# Patient Record
Sex: Female | Born: 1960 | Race: White | Hispanic: No | Marital: Married | Smoking: Former smoker
Health system: Southern US, Community
[De-identification: ages and names within clinical notes are randomized; demographics above are authoritative.]

## PROBLEM LIST (undated history)

## (undated) DIAGNOSIS — R011 Cardiac murmur, unspecified: Secondary | ICD-10-CM

## (undated) DIAGNOSIS — J069 Acute upper respiratory infection, unspecified: Secondary | ICD-10-CM

## (undated) DIAGNOSIS — N189 Chronic kidney disease, unspecified: Secondary | ICD-10-CM

## (undated) DIAGNOSIS — N2889 Other specified disorders of kidney and ureter: Secondary | ICD-10-CM

## (undated) HISTORY — PX: FRACTURE SURGERY: SHX138

## (undated) HISTORY — PX: FOOT SURGERY: SHX648

## (undated) HISTORY — PX: OTHER SURGICAL HISTORY: SHX169

---

## 1999-01-28 DIAGNOSIS — D682 Hereditary deficiency of other clotting factors: Secondary | ICD-10-CM

## 1999-01-28 HISTORY — DX: Hereditary deficiency of other clotting factors: D68.2

## 2003-05-15 ENCOUNTER — Other Ambulatory Visit: Admission: RE | Admit: 2003-05-15 | Discharge: 2003-05-15 | Payer: Self-pay | Admitting: Obstetrics and Gynecology

## 2004-02-06 ENCOUNTER — Ambulatory Visit (HOSPITAL_COMMUNITY): Admission: RE | Admit: 2004-02-06 | Discharge: 2004-02-06 | Payer: Self-pay | Admitting: Obstetrics and Gynecology

## 2004-03-05 ENCOUNTER — Ambulatory Visit (HOSPITAL_COMMUNITY): Admission: RE | Admit: 2004-03-05 | Discharge: 2004-03-05 | Payer: Self-pay | Admitting: Obstetrics and Gynecology

## 2004-03-05 ENCOUNTER — Encounter (INDEPENDENT_AMBULATORY_CARE_PROVIDER_SITE_OTHER): Payer: Self-pay | Admitting: Specialist

## 2004-08-08 ENCOUNTER — Other Ambulatory Visit: Admission: RE | Admit: 2004-08-08 | Discharge: 2004-08-08 | Payer: Self-pay | Admitting: Obstetrics and Gynecology

## 2004-10-28 ENCOUNTER — Ambulatory Visit (HOSPITAL_COMMUNITY): Admission: RE | Admit: 2004-10-28 | Discharge: 2004-10-28 | Payer: Self-pay | Admitting: Obstetrics and Gynecology

## 2004-10-31 ENCOUNTER — Inpatient Hospital Stay (HOSPITAL_COMMUNITY): Admission: AD | Admit: 2004-10-31 | Discharge: 2004-10-31 | Payer: Self-pay | Admitting: Obstetrics and Gynecology

## 2005-03-26 ENCOUNTER — Ambulatory Visit: Payer: Self-pay | Admitting: Sports Medicine

## 2005-05-07 ENCOUNTER — Ambulatory Visit: Payer: Self-pay | Admitting: Sports Medicine

## 2006-04-01 ENCOUNTER — Inpatient Hospital Stay (HOSPITAL_COMMUNITY): Admission: AD | Admit: 2006-04-01 | Discharge: 2006-04-01 | Payer: Self-pay | Admitting: Obstetrics and Gynecology

## 2006-04-04 ENCOUNTER — Inpatient Hospital Stay (HOSPITAL_COMMUNITY): Admission: AD | Admit: 2006-04-04 | Discharge: 2006-04-04 | Payer: Self-pay | Admitting: Obstetrics and Gynecology

## 2006-05-25 ENCOUNTER — Ambulatory Visit (HOSPITAL_COMMUNITY): Admission: RE | Admit: 2006-05-25 | Discharge: 2006-05-25 | Payer: Self-pay | Admitting: Obstetrics and Gynecology

## 2007-07-06 ENCOUNTER — Ambulatory Visit (HOSPITAL_BASED_OUTPATIENT_CLINIC_OR_DEPARTMENT_OTHER): Admission: RE | Admit: 2007-07-06 | Discharge: 2007-07-06 | Payer: Self-pay | Admitting: Obstetrics and Gynecology

## 2007-08-12 ENCOUNTER — Ambulatory Visit: Payer: Self-pay | Admitting: Hematology

## 2007-08-30 ENCOUNTER — Ambulatory Visit (HOSPITAL_COMMUNITY): Admission: RE | Admit: 2007-08-30 | Discharge: 2007-08-30 | Payer: Self-pay | Admitting: Obstetrics & Gynecology

## 2007-08-30 ENCOUNTER — Encounter (INDEPENDENT_AMBULATORY_CARE_PROVIDER_SITE_OTHER): Payer: Self-pay | Admitting: Obstetrics & Gynecology

## 2007-09-02 LAB — CBC WITH DIFFERENTIAL/PLATELET
BASO%: 0.4 % (ref 0.0–2.0)
Basophils Absolute: 0 10*3/uL (ref 0.0–0.1)
EOS%: 1.3 % (ref 0.0–7.0)
Eosinophils Absolute: 0.1 10*3/uL (ref 0.0–0.5)
HCT: 44 % (ref 34.8–46.6)
HGB: 14.9 g/dL (ref 11.6–15.9)
LYMPH%: 18.5 % (ref 14.0–48.0)
MCH: 32.7 pg (ref 26.0–34.0)
MCHC: 33.8 g/dL (ref 32.0–36.0)
MCV: 96.8 fL (ref 81.0–101.0)
MONO#: 0.5 10*3/uL (ref 0.1–0.9)
MONO%: 8.2 % (ref 0.0–13.0)
NEUT#: 4.5 10*3/uL (ref 1.5–6.5)
NEUT%: 71.6 % (ref 39.6–76.8)
Platelets: 248 10*3/uL (ref 145–400)
RBC: 4.54 10*6/uL (ref 3.70–5.32)
RDW: 12.5 % (ref 11.3–14.5)
WBC: 6.2 10*3/uL (ref 3.9–10.0)
lymph#: 1.2 10*3/uL (ref 0.9–3.3)

## 2007-09-07 LAB — COMPREHENSIVE METABOLIC PANEL
ALT: 16 U/L (ref 0–35)
AST: 18 U/L (ref 0–37)
Albumin: 4.7 g/dL (ref 3.5–5.2)
Alkaline Phosphatase: 58 U/L (ref 39–117)
BUN: 14 mg/dL (ref 6–23)
CO2: 23 mEq/L (ref 19–32)
Calcium: 9.9 mg/dL (ref 8.4–10.5)
Chloride: 103 mEq/L (ref 96–112)
Creatinine, Ser: 0.86 mg/dL (ref 0.40–1.20)
Glucose, Bld: 104 mg/dL — ABNORMAL HIGH (ref 70–99)
Potassium: 4.1 mEq/L (ref 3.5–5.3)
Sodium: 140 mEq/L (ref 135–145)
Total Bilirubin: 0.4 mg/dL (ref 0.3–1.2)
Total Protein: 8.3 g/dL (ref 6.0–8.3)

## 2007-09-07 LAB — PROTHROMBIN TIME: Prothrombin Time: 12.7 seconds (ref 11.6–15.2)

## 2007-09-07 LAB — LUPUS ANTICOAGULANT PANEL
DRVVT: 39.1 secs (ref 36.1–47.0)
PTT Lupus Anticoagulant: 36.4 secs (ref 36.3–48.8)

## 2007-09-07 LAB — PROTEIN C, TOTAL: Protein C, Total: 99 % (ref 70–140)

## 2007-09-07 LAB — PROTEIN C ACTIVITY: Protein C Activity: 166 % — ABNORMAL HIGH (ref 75–133)

## 2007-09-07 LAB — PROTHROMBIN GENE MUTATION

## 2007-09-07 LAB — ANTITHROMBIN III: AntiThromb III Func: 113 % (ref 76–126)

## 2007-09-07 LAB — PROTEIN S, TOTAL: Protein S Ag, Total: 109 % (ref 70–140)

## 2007-09-07 LAB — APTT: aPTT: 29 seconds (ref 24–37)

## 2007-09-07 LAB — PROTEIN S ACTIVITY: Protein S Activity: 90 % (ref 69–129)

## 2007-12-27 ENCOUNTER — Ambulatory Visit: Payer: Self-pay | Admitting: Internal Medicine

## 2007-12-27 DIAGNOSIS — L708 Other acne: Secondary | ICD-10-CM

## 2007-12-27 DIAGNOSIS — D682 Hereditary deficiency of other clotting factors: Secondary | ICD-10-CM

## 2007-12-27 DIAGNOSIS — S93409A Sprain of unspecified ligament of unspecified ankle, initial encounter: Secondary | ICD-10-CM | POA: Insufficient documentation

## 2008-05-08 ENCOUNTER — Ambulatory Visit: Payer: Self-pay | Admitting: Internal Medicine

## 2008-05-22 ENCOUNTER — Telehealth: Payer: Self-pay | Admitting: Internal Medicine

## 2008-05-22 ENCOUNTER — Ambulatory Visit: Payer: Self-pay | Admitting: Internal Medicine

## 2008-05-22 DIAGNOSIS — L739 Follicular disorder, unspecified: Secondary | ICD-10-CM | POA: Insufficient documentation

## 2008-05-22 LAB — CONVERTED CEMR LAB
BUN: 14 mg/dL (ref 6–23)
CO2: 27 meq/L (ref 19–32)
Calcium: 8.7 mg/dL (ref 8.4–10.5)
Chloride: 110 meq/L (ref 96–112)
Creatinine, Ser: 0.7 mg/dL (ref 0.4–1.2)
GFR calc non Af Amer: 95.82 mL/min (ref >60)
Glucose, Bld: 110 mg/dL — ABNORMAL HIGH (ref 70–99)
Potassium: 3.9 meq/L (ref 3.5–5.1)
Sodium: 140 meq/L (ref 135–145)

## 2008-06-20 ENCOUNTER — Ambulatory Visit: Payer: Self-pay | Admitting: Internal Medicine

## 2008-07-06 ENCOUNTER — Ambulatory Visit: Payer: Self-pay | Admitting: Diagnostic Radiology

## 2008-07-06 ENCOUNTER — Ambulatory Visit (HOSPITAL_BASED_OUTPATIENT_CLINIC_OR_DEPARTMENT_OTHER): Admission: RE | Admit: 2008-07-06 | Discharge: 2008-07-06 | Payer: Self-pay | Admitting: Obstetrics and Gynecology

## 2008-08-16 ENCOUNTER — Telehealth: Payer: Self-pay | Admitting: Internal Medicine

## 2009-02-28 ENCOUNTER — Ambulatory Visit: Payer: Self-pay | Admitting: Family

## 2009-02-28 DIAGNOSIS — J329 Chronic sinusitis, unspecified: Secondary | ICD-10-CM | POA: Insufficient documentation

## 2009-05-01 ENCOUNTER — Ambulatory Visit: Payer: Self-pay | Admitting: Family

## 2009-05-01 DIAGNOSIS — B359 Dermatophytosis, unspecified: Secondary | ICD-10-CM | POA: Insufficient documentation

## 2009-05-04 ENCOUNTER — Ambulatory Visit: Payer: Self-pay | Admitting: Family

## 2009-07-25 ENCOUNTER — Ambulatory Visit (HOSPITAL_BASED_OUTPATIENT_CLINIC_OR_DEPARTMENT_OTHER): Admission: RE | Admit: 2009-07-25 | Discharge: 2009-07-25 | Payer: Self-pay | Admitting: Obstetrics and Gynecology

## 2009-07-25 ENCOUNTER — Ambulatory Visit: Payer: Self-pay | Admitting: Diagnostic Radiology

## 2009-09-14 ENCOUNTER — Telehealth: Payer: Self-pay | Admitting: Internal Medicine

## 2009-09-20 ENCOUNTER — Ambulatory Visit: Payer: Self-pay | Admitting: Internal Medicine

## 2009-09-20 DIAGNOSIS — L0291 Cutaneous abscess, unspecified: Secondary | ICD-10-CM | POA: Insufficient documentation

## 2009-09-20 DIAGNOSIS — L0390 Cellulitis, unspecified: Secondary | ICD-10-CM

## 2009-09-20 LAB — CONVERTED CEMR LAB
Basophils Absolute: 0 10*3/uL (ref 0.0–0.1)
Basophils Relative: 1 % (ref 0–1)
CRP: 3.4 mg/dL — ABNORMAL HIGH (ref <0.6)
Eosinophils Absolute: 0.1 10*3/uL (ref 0.0–0.7)
Eosinophils Relative: 1 % (ref 0–5)
HCT: 34.6 % — ABNORMAL LOW (ref 36.0–46.0)
Hemoglobin: 11.3 g/dL — ABNORMAL LOW (ref 12.0–15.0)
Lymphocytes Relative: 25 % (ref 12–46)
Lymphs Abs: 1.7 10*3/uL (ref 0.7–4.0)
MCHC: 32.7 g/dL (ref 30.0–36.0)
MCV: 96.1 fL (ref 78.0–100.0)
Monocytes Absolute: 0.6 10*3/uL (ref 0.1–1.0)
Monocytes Relative: 9 % (ref 3–12)
Neutro Abs: 4.5 10*3/uL (ref 1.7–7.7)
Neutrophils Relative %: 65 % (ref 43–77)
Platelets: 460 10*3/uL — ABNORMAL HIGH (ref 150–400)
RBC: 3.6 M/uL — ABNORMAL LOW (ref 3.87–5.11)
RDW: 13.3 % (ref 11.5–15.5)
Sed Rate: 62 mm/hr — ABNORMAL HIGH (ref 0–22)
WBC: 7 10*3/uL (ref 4.0–10.5)

## 2009-09-21 ENCOUNTER — Telehealth: Payer: Self-pay | Admitting: Internal Medicine

## 2009-09-21 ENCOUNTER — Ambulatory Visit: Payer: Self-pay | Admitting: Hematology & Oncology

## 2009-09-24 ENCOUNTER — Encounter: Payer: Self-pay | Admitting: Internal Medicine

## 2009-09-24 ENCOUNTER — Telehealth: Payer: Self-pay | Admitting: Internal Medicine

## 2009-09-24 LAB — CONVERTED CEMR LAB
Basophils Absolute: 0 10*3/uL (ref 0.0–0.1)
Basophils Relative: 1 % (ref 0–1)
CRP, High Sensitivity: 14.2 — ABNORMAL HIGH
Eosinophils Absolute: 0.1 10*3/uL (ref 0.0–0.7)
Eosinophils Relative: 1 % (ref 0–5)
HCT: 36.4 % (ref 36.0–46.0)
Hemoglobin: 11.7 g/dL — ABNORMAL LOW (ref 12.0–15.0)
Lymphocytes Relative: 14 % (ref 12–46)
Lymphs Abs: 1 10*3/uL (ref 0.7–4.0)
MCHC: 32.1 g/dL (ref 30.0–36.0)
MCV: 98.1 fL (ref 78.0–100.0)
Monocytes Absolute: 0.5 10*3/uL (ref 0.1–1.0)
Monocytes Relative: 6 % (ref 3–12)
Neutro Abs: 5.7 10*3/uL (ref 1.7–7.7)
Neutrophils Relative %: 78 % — ABNORMAL HIGH (ref 43–77)
Platelets: 514 10*3/uL — ABNORMAL HIGH (ref 150–400)
RBC: 3.71 M/uL — ABNORMAL LOW (ref 3.87–5.11)
RDW: 13.5 % (ref 11.5–15.5)
Sed Rate: 47 mm/hr — ABNORMAL HIGH (ref 0–22)
WBC: 7.3 10*3/uL (ref 4.0–10.5)

## 2009-09-26 ENCOUNTER — Ambulatory Visit: Payer: Self-pay | Admitting: Infectious Disease

## 2009-09-26 DIAGNOSIS — M009 Pyogenic arthritis, unspecified: Secondary | ICD-10-CM | POA: Insufficient documentation

## 2009-09-26 DIAGNOSIS — M8618 Other acute osteomyelitis, other site: Secondary | ICD-10-CM

## 2009-09-26 DIAGNOSIS — A4101 Sepsis due to Methicillin susceptible Staphylococcus aureus: Secondary | ICD-10-CM

## 2009-09-27 ENCOUNTER — Encounter: Payer: Self-pay | Admitting: Infectious Disease

## 2009-09-27 ENCOUNTER — Telehealth: Payer: Self-pay | Admitting: Internal Medicine

## 2009-09-28 ENCOUNTER — Telehealth: Payer: Self-pay | Admitting: Internal Medicine

## 2009-10-02 ENCOUNTER — Encounter: Payer: Self-pay | Admitting: Infectious Disease

## 2009-10-09 ENCOUNTER — Encounter: Payer: Self-pay | Admitting: Infectious Disease

## 2009-10-10 ENCOUNTER — Encounter: Payer: Self-pay | Admitting: Internal Medicine

## 2009-10-11 ENCOUNTER — Encounter: Payer: Self-pay | Admitting: Infectious Disease

## 2009-10-12 ENCOUNTER — Telehealth: Payer: Self-pay | Admitting: Internal Medicine

## 2009-10-31 ENCOUNTER — Encounter: Payer: Self-pay | Admitting: Internal Medicine

## 2009-11-07 ENCOUNTER — Telehealth: Payer: Self-pay | Admitting: Infectious Disease

## 2009-12-17 ENCOUNTER — Telehealth: Payer: Self-pay | Admitting: Internal Medicine

## 2010-01-16 ENCOUNTER — Telehealth: Payer: Self-pay | Admitting: Internal Medicine

## 2010-01-16 LAB — CONVERTED CEMR LAB
BUN: 15 mg/dL (ref 6–23)
CO2: 24 meq/L (ref 19–32)
Calcium: 9.6 mg/dL (ref 8.4–10.5)
Chloride: 102 meq/L (ref 96–112)
Creatinine, Ser: 0.84 mg/dL (ref 0.40–1.20)
Glucose, Bld: 97 mg/dL (ref 70–99)
Potassium: 4.5 meq/L (ref 3.5–5.3)
Sodium: 139 meq/L (ref 135–145)

## 2010-01-17 ENCOUNTER — Telehealth: Payer: Self-pay | Admitting: Internal Medicine

## 2010-02-17 ENCOUNTER — Encounter: Payer: Self-pay | Admitting: Infectious Disease

## 2010-02-17 ENCOUNTER — Encounter: Payer: Self-pay | Admitting: Family Medicine

## 2010-02-26 NOTE — Miscellaneous (Signed)
Summary: [facility]: Orders  [facility]: Orders   Imported By: Florinda Marker 10/11/2009 14:26:14  _____________________________________________________________________  External Attachment:    Type:   Image     Comment:   External Document

## 2010-02-26 NOTE — Progress Notes (Signed)
Summary: Hospital Follow up   Phone Note Other Incoming Call back at [phone]   Caller: Clydie Braun Clinical Social Worker - [facility] in [place] Request: Send information Summary of Call: Clydie Braun from  [facility] called stating patient will be discharged from hospital and will be heading back to [facility] on Monday. She wanted to make sure that patient had a follow up appointment scheduled with Dr Artist Pais after she returns home. Clydie Braun states patient was there on vacation and end up complaining about some pain she was having in her neck. She ended up with Sepsis in her right clavicle, and an abcess in her neck. . She is being  discharge on infusion pump Oxicillin 3gm  every 6 hour (6/12). Her last dose will be 8/31 at noon. She has been evaluated there by Infectious disease. Per Clydie Braun orders for patient for Keflex and pic line change, will be sent.  Patient is scheduled for hospital visit on 8/25 @ 11AM Initial call taken by: Glendell Docker CMA,  September 14, 2009 5:30 PM  Follow-up for Phone Call        plz find out if she requires order from [place] for abx or picc line care.  Please see if ID specialist can also see her within 1 week  Follow-up by: D. Thomos Lemons DO,  September 18, 2009 5:35 PM  Additional Follow-up for Phone Call Additional follow up Details #1::        call placed to Infectious Disease at [phone], no answer. Voice message left requesting a return phone call to schedule appointment.  call placed to patient at  [phone], no answer. voice message left for patient to return call prior to Thursday 8/25 appt.  Glendell Docker CMA  September 18, 2009 5:43 PM     Additional Follow-up for Phone Call Additional follow up Details #2::    spoke with Selena Batten at infectious disease, she was informed of patients condition.She states she will fax a referral form to office,and has asked if office notes and labs would be faxed along with referral. Selena Batten states she will contact patient for  follow up appointment tomorrow afternoon after  patient sees Dr Lin Givens CMA  September 19, 2009 10:21 AM   patient returned call and left voice message stating she has returned from [place] and can be reached at [phone], she should be home for most of the day Glendell Docker [facility]  September 19, 2009 12:40 PM   referral paperwork has been received, and completed. Hospital information has been faxed to [facility] at Infectious Disease at [phone] Glendell Docker CMA  September 19, 2009 1:41 PM

## 2010-02-26 NOTE — Assessment & Plan Note (Signed)
Summary: cpx/mhf Dr Artist Pais pt but has to have by Friday for work/mhf   Vital Signs:  Patient profile:   50 year old female Height:      66 inches Weight:      166 pounds BMI:     26.89 Temp:     98.1 degrees F oral Pulse rate:   64 / minute Pulse rhythm:   regular Resp:     16 per minute BP sitting:   132 / 70  (right arm) Cuff size:   regular  Vitals Entered By: Mervin Kung CMA (May 01, 2009 2:54 PM) CC: room 6  Pt needs physical for work.   Primary Care Provider:  Dondra Spry DO  CC:  room 6  Pt needs physical for work..  History of Present Illness: Holly Keller is a 50 year old female who presents today for a CPX.   Denies any concerns today.    Preventative- Unsure when last tetanus shot was- thinks that her last tetanus was given at urgent care about 5 years ago.   Patient runs regularly- ran a marathon in November 2010.  Diet is fair.  Notes on and off smoking- currently quit.  Factor V Deficiency - has been recommended to the patient that she take a daily aspirin.   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 Alzheimers - mother Colon polyps - mother Factor V leiden - sister (hx of blood clots) Alcoholic cirrhosis - father (died age 81) DM II - grandmother    Social History: Reviewed history from 06/20/2008 and no changes required. 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 Resp: Denies cough CV:  Denies Chest Pain GI:  Denies nausea or vomitting GU: Denies dysuria Lymphatic: Denies lymphadenopathy Musculoskeletal:  Denies muscle/joint pain Skin: 2 dry itching areas on right forearm Psychiatric: Denies depression or anxiety Neuro: Notes + history of carpal tunnel syndrome, occasionally notices in right hand when holding phone     Physical Exam  General:  Well-developed,well-nourished,in no acute distress; alert,appropriate and cooperative throughout examination Head:  Normocephalic and atraumatic without obvious abnormalities. No apparent alopecia or balding. Eyes:  PERRLA Ears:  External ear exam shows no significant lesions or deformities.  Otoscopic examination reveals clear canals, tympanic membranes are intact bilaterally without bulging, retraction, inflammation or discharge. Hearing is grossly normal bilaterally. Mouth:  Oral mucosa and oropharynx without lesions or exudates.  Teeth in good repair. Neck:  No deformities, masses, or tenderness noted.  Breasts:  deferred to GYN Lungs:  Normal respiratory effort, chest expands symmetrically. Lungs are clear to auscultation, no crackles or wheezes. Heart:  Normal rate and regular rhythm. S1 and S2 normal without gallop, murmur, click, rub or other extra sounds. Abdomen:  Bowel sounds positive,abdomen soft and non-tender without masses, organomegaly or hernias noted. Genitalia:  deferred to GYN Msk:  No  deformity or scoliosis noted of thoracic or lumbar spine.   Extremities:  No clubbing, cyanosis, edema, or deformity noted with normal full range of motion of all joints.   Neurologic:  No cranial nerve deficits noted. Station and gait are normal. Plantar reflexes are down-going bilaterally. DTRs are symmetrical throughout. Sensory, motor and coordinative functions appear intact. Skin:  + scarring noted on face from cystic acne.   Two dry round scaly 1 cm patches on right forearm Cervical Nodes:  No lymphadenopathy noted Psych:  Cognition and judgment appear intact. Alert and cooperative with normal attention span and concentration. No apparent delusions, illusions, hallucinations   Impression & Recommendations:  Problem # 1:  Preventive Health Care (ICD-V70.0) Patient is up to date on immunizations as well as mammogram and Pap which are performed by GYN.  I encouraged a healthy diet and continuation of her regular exercise.  PPD placed today- after this is read, will fill out form for her employer.    Problem # 2:  RINGWORM (ICD-110.9) Assessment: New Will treat with lotrimin ultra  Problem # 3:  FACTOR V DEFICIENCY (ICD-286.3) Assessment: Comment Only Pt encouraged to resume daily ASA 81mg   Complete Medication List: 1)  Ecotrin Low Strength 81 Mg Tbec (Aspirin) .... One tablet by mouth daily 2)  Lotrimin Ultra 1 % Crea (Butenafine hcl) .... Apply twice daily to affected areas until healed  Patient Instructions: 1)  Keep up the good work with diet and exercise. 2)  Please follow up fasting for a lab draw: 3)  CBC, BMET, FLP, TSH (v70)      Current Allergies (reviewed today): No known allergies

## 2010-02-26 NOTE — Progress Notes (Addendum)
Summary: Care Plan Oversight  Phone Note Outgoing Call   Call placed by: Acey Lav MD,  November 07, 2009 12:05 PM Details for Reason: Care Plan Oversight Summary of Call: [phone](> 60 mins) I have supervised home care and/or infusion therapy for this pt, including providing orders for care, review of labs and/or home health care plans, communicating with the home health care professionals and/or patient/caregivers to integrate current information into the medical treatment plan and/or adjust the medical therapy. This supervision has been provided for _62__minutes during the calendar month.  Dates for this oversight:. September 26, 2009 thru October 26, 2009  Treatment for MSSA bacteremia and sternoclavicular osteomyelitis.    Initial call taken by: Acey Lav MD,  November 07, 2009 12:08 PM

## 2010-02-26 NOTE — Letter (Signed)
Summary: [facility]: Pre-Author  [facility]: Pre-Author   Imported By: Florinda Marker 10/24/2009 14:28:24  _____________________________________________________________________  External Attachment:    Type:   Image     Comment:   External Document

## 2010-02-26 NOTE — Progress Notes (Signed)
Summary: Wants lab results today  Phone Note Call from Patient Call back at [phone]-- can leave message on voicemail    Caller: Patient Call For: D. Thomos Lemons DO Summary of Call: Pt called requesting lab results.  She states it is ok to leave results on her voicemail. She would also like the specific lab values when we call with the results.  Please advise. Nicki Guadalajara Fergerson CMA Duncan Dull)  September 21, 2009 12:04 PM   Follow-up for Phone Call        call placed to patient to inform her of infectious disease appointment. She states she is suppose to have blood work every 5 days and is scheduled to return on Monday for the labs She would like to know, does she still need to return on Monday for the blood work?  Follow-up by: Glendell Docker CMA,  September 21, 2009 12:35 PM  Additional Follow-up for Phone Call Additional follow up Details #1::        yes, return on monday and every 5 days x 1 month same labs with same code  also fax copy of labs to ID specialist in [place], [place] Dr. Waylan Boga Additional Follow-up by: D. Thomos Lemons DO,  September 21, 2009 12:44 PM    Additional Follow-up for Phone Call Additional follow up Details #2::    call returned to patient, she has been advised per Dr Artist Pais instructions on blood work  Appointment for the [facility] for  Infectious Disease - Dr Paulette Blanch Dam  09/26/2009 @ 11am. Patient advised.  She wiill return on Monday for blood work  Follow-up by: Glendell Docker CMA,  September 21, 2009 4:27 PM  Additional Follow-up for Phone Call Additional follow up Details #3:: Details for Additional Follow-up Action Taken: received  call from Alvino Chapel at  Dr Drue Dun, they are unable to assist patient with pic line removal. She states it is done through Interventional Radiology [phone]) Sharlyne Cai.  Alvino Chapel stated they would be more than happy to assist patient with reviewing of flushing the line, but they are unable to remove the pic line due to the accountability.  Call has been placed to interventional radiology, gone for the day and will return on Monday Glendell Docker CMA  September 21, 2009 4:48 PM

## 2010-02-26 NOTE — Assessment & Plan Note (Signed)
Summary: HOSPITAL FOLLOW UP /DK   Vital Signs:  Patient profile:   50 year old female Weight:      158 pounds BMI:     25.59 O2 Sat:      99 % on Room air Temp:     98.0 degrees F oral Pulse rate:   73 / minute Pulse rhythm:   regular Resp:     16 per minute BP sitting:   110 / 70  (right arm) Cuff size:   regular  Vitals Entered By: Glendell Docker CMA (September 20, 2009 11:18 AM)  O2 Flow:  Room air CC: Hospital Follow up  Is Patient Diabetic? No Pain Assessment Patient in pain? no        Primary Care Provider:  Dondra Spry DO  CC:  Hospital Follow up .  History of Present Illness: 50 y/o white female for hospital follow up.  pt tx'ed in hosp in [place] while visiting her sister who is physician. she started experiencing pain right shoulder - 08/27/2009.  She originally attributed to muscle / shoulder strain.   She was seen by ortho while she was in [place]  and received cortisone injection (posterior shoulder)  Initially, her symptoms improved but gradually got worse and worse despite pain meds and muslce relaxers. she was referred to ortho in [place], [place] who was concerned about monoarticular arthritis of right SCM joint CT of  neck noted soft tissue thickening and induration within  the region of the right SCJ, extending cephald into the right SCM muscle.  wound culture showed MSSA.  she was bacteremic  drain placed during surgery - drain pulled 1-2 days later still draining serosanguinous fluid , small amt of pus  question source of infection she notes left shin infection 1 month ago dental work previous to incident - unclear if source of infection 2D echo (transthoracic normal) after getting home -  woke up with sweats at night she has hx of night sweats she did not take her temp  Hospital records reviewed.     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    Physical Exam  General:  alert, well-developed, and well-nourished.   Head:  normocephalic and atraumatic.   Eyes:  pupils equal, pupils round, and pupils reactive to light.  no conjunctival petechiae Mouth:  good dentition and pharynx pink and moist.   Neck:  no neck tenderness.  right ant SCM nodes firm and enlarged right SCJ incision looks clean and dry .  no drainage, redness or tenderness Chest Wall:  no tenderness and no mass.   Lungs:  normal respiratory effort and normal breath sounds.   Heart:  normal rate, regular rhythm, no murmur, and no gallop.   Abdomen:  soft, non-tender, normal bowel sounds, no  masses, and no rigidity.   Extremities:  No lower extremity edema Neurologic:  cranial nerves II-XII intact and gait normal.   Psych:  normally interactive, good eye contact, not anxious appearing, and not depressed appearing.     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  she is s/p debridement 09/12/2009. ID specialist - Dr. Damian Leavell in [place], [place] raised concern she has some dental work previous to start of her symptoms.   pt also notes skin infection in left shin prior to start of her symptoms.  wound culture showed MSSA.  she was also bacteremic.  Left PICC line placed.  she has been  administering IV oxacillin 3 grams q 6 hrs.   Plan is for IV abx until 8/31 with transition to keflex 500 mg qid x 4 weeks if blood tests stable.   ID rec monitoring CBC, sed rate, CRP, and Cr q 5 days.   she notes incision has been draining serosanguinous fluid and small amount of pus also hx of night sweats but she it feels like her prev night sweats 2D echo - thoracic not suggestive of endocarditis.  obtain labs today.  arrange f/u with local ID specialist. refer to Dr. Tama Gander office for help with infusion pump flush.  If symptoms right SCM joint drainage continues, consider repeat CT of neck.   Orders: T-CBC w/Diff [id number]) T-C-Reactive Protein [phone]) T- Sed rate non-auto (29562) T-Creatine [phone]) Misc. Referral (Misc. Ref)  Complete Medication List: 1)  Ecotrin Low Strength 81 Mg Tbec (Aspirin) .... One tablet by mouth daily 2)  Hydrocodone-acetaminophen 10-325 Mg Tabs (Hydrocodone-acetaminophen) .Marland Kitchen.. 1-2 tablets by mouth  every 6 hours as needed pain  Patient Instructions: 1)  Follow up 09/28/2009.   2)  Call our office if your develop worsening pain or fever.  Current Allergies (reviewed today): No known allergies    Preventive Care Screening  Mammogram:    Date:  08/07/2009    Results:  normal

## 2010-02-26 NOTE — Assessment & Plan Note (Signed)
Summary: nur to read tb test   Primary Care Provider:  Dondra Spry DO   History of Present Illness: The patient presented after 48 hours to check the injection site for positive or negative reaction.   Injection site examination: No firm bump forms at the test site.  Slightly reddish appearance and diameter was smaller than 5mm.   Assessment & Plan: Negative TB skin test.  Patient was counseled to call if experiences any irritation of site.  Mervin Kung, CMA    Allergies: No Known Drug Allergies   Complete Medication List: 1)  Ecotrin Low Strength 81 Mg Tbec (Aspirin) .... One tablet by mouth daily 2)  Lotrimin Ultra 1 % Crea (Butenafine hcl) .... Apply twice daily to affected areas until healed   PPD Results    Date of reading: 05/04/2009    Results: < 5mm    Interpretation: negative    Preventive Care Screening  PPD:    Date:  05/04/2009    Results:  negative  Last Tetanus Booster:    Date:  11/28/2003    Results:  Historical

## 2010-02-26 NOTE — Progress Notes (Signed)
  Phone Note From Other Clinic   Summary of Call: discussed coumadin issue with Dr. Synthia Innocent.  I will discuss case with Dr. Myna Hidalgo and make a decision about anticoagulation while PICC in place  Discussed case with Dr. Myna Hidalgo.  he does not suggest full anticoagulation.  he sometimes use 1 mg dose in cancer patients with porta cath. I agree.    LMOVM for pt to call office  D. Thomos Lemons DO  September 27, 2009 6:07 PM   Initial call taken by: D. Thomos Lemons DO,  September 27, 2009 1:28 PM  Follow-up for Phone Call        she requests 2nd opinion from ID specialist - at [facility] Follow-up by: D. Thomos Lemons DO,  September 28, 2009 5:16 PM    New/Updated Medications: COUMADIN 1 MG TABS (WARFARIN SODIUM) one by mouth once daily Prescriptions: COUMADIN 1 MG TABS (WARFARIN SODIUM) one by mouth once daily  #30 x 0   Entered and Authorized by:   D. Thomos Lemons DO   Signed by:   D. Thomos Lemons DO on 09/28/2009   Method used:   Electronically to        [facility] (retail)       [address]       [place], [place]  [address]       Ph: [phone]       Fax: [phone]   RxID:   [id number]

## 2010-02-26 NOTE — Progress Notes (Signed)
Summary: Status Update  Phone Note Outgoing Call   Call placed by: Glendell Docker CMA,  September 28, 2009 5:11 PM Call placed to: [facility]- [phone] Summary of Call: call placed to [facility] to Lovenia Kim at [facility] she states patient was referred by Dr Algis Liming with infectious disease. [facility] will be seeing the patient in home care to draw line and provide pic line care. The labs they will be monitoring are cbc an bmp. Marcelino Duster stated if additional labs were needed she will need an order from Dr Artist Pais.    Initial call taken by: Glendell Docker CMA,  September 28, 2009 5:19 PM  Follow-up for Phone Call        call returned to patient at [phone], she was advised Coumadin has been sent to the pharmacy, and Dr Artist Pais will have an INR drawn with her labs scheduled for Tuesday.  Lab order for INR has been faxed to [facility] at [phone] Follow-up by: Glendell Docker CMA,  September 28, 2009 5:27 PM

## 2010-02-26 NOTE — Progress Notes (Signed)
Summary: update from patient   Phone Note Call from Patient   Caller: Patient Call For: yoo Summary of Call: wants to let Dr Artist Pais know that she switched to Dr Trisha Mangle and her C reactive is at 0.4 and her sed rate is 15.  She has another week of liquid antibiotic and then she will have her pic line removed  Initial call taken by: Roselle Locus,  October 12, 2009 10:14 AM  Follow-up for Phone Call        noted Follow-up by: D. Thomos Lemons DO,  October 12, 2009 5:23 PM     Appended Document: update from patient  I have still been receiving labs from [facility]. I had CT surgery contact the patient and tried to get CT scan. Despite multiple phone calls from Dr. Zenaida Niece Trigt's office the pt refused to be seen. I have no doubt whatsoever that the pt had sternoclavicular septic arthritis and osteomyelitis. She may indeed survive with antibiotics alone and I understand she is on oral keflex now. Certainly she should have receiveed at minimum 4 wks IV antibiotics for her bacteremia, but really should have had 8 weeks of systemic therapy for the [facility] joint given poor perfusion of this area. She may still need surgery, but maybe she will survive without it. I am dc her from this clinic since she is following another ID provider in [place]

## 2010-02-26 NOTE — Assessment & Plan Note (Signed)
Summary: CONGESTION COUGH/MHF   Vital Signs:  Patient profile:   50 year old female Height:      66 inches Weight:      156 pounds BMI:     25.27 O2 Sat:      98 % Temp:     98.6 degrees F oral Pulse rate:   52 / minute BP sitting:   120 / 78  (left arm)  Vitals Entered By: Doristine Devoid (February 28, 2009 1:36 PM) CC: cough and chest congestion x2 wks    Primary Care Provider:  Dondra Spry DO  CC:  cough and chest congestion x2 wks .  History of Present Illness: Ms Jinkins presents today with 2 week history of head and chest congestion.  "just wants to crawl in bed and stay there." Nasal discharge is yellow, cough is not always productive.  She denies fever- but hasn't checked.  has using theraflua and mucinex without improvement. Fells "foggy" but denies sinus pressure.    Allergies: No Known Drug Allergies  Physical Exam  General:  Well-developed,well-nourished,in no acute distress; alert,appropriate and cooperative throughout examination Eyes:  PERRLA Ears:  Bilateral effusions without erythema or bulging Mouth:  Oral mucosa and oropharynx without lesions or exudates.  Teeth in good repair. Lungs:  Normal respiratory effort, chest expands symmetrically. Lungs are clear to auscultation, no crackles or wheezes. Heart:  Normal rate and regular rhythm. S1 and S2 normal without gallop, murmur, click, rub or other extra sounds.   Impression & Recommendations:  Problem # 1:  SINUSITIS (ICD-473.9) Assessment New  will treat with augmentin,  pt instructed to call if you develop fever over 101, increasing sinus pressure, pain with eye movement, increased facial tenderness of swelling, or if  visual changes.  Will add tessalon as needed cough.     Her updated medication list for this problem includes:    Augmentin 500-125 Mg Tabs (Amoxicillin-pot clavulanate) ..... One to two tabs by mouth every 12 hours x 10 days    Tessalon Perles 100 Mg Caps (Benzonatate) ..... One cap by  mouth three times a day as needed for cough  Complete Medication List: 1)  Clindamycin Phos-benzoyl Perox 1-5 % Gel (Clindamycin phos-benzoyl perox) .... Use once daily 2)  Augmentin 500-125 Mg Tabs (Amoxicillin-pot clavulanate) .... One to two tabs by mouth every 12 hours x 10 days 3)  Tessalon Perles 100 Mg Caps (Benzonatate) .... One cap by mouth three times a day as needed for cough  Patient Instructions: 1)  Call if you develop fever over 101, increasing sinus pressure, pain with eye movement, increased facial tenderness of swelling, or if you develop visual changes. 2)  You may find that using a neti pot twice a day helps with your symptoms also.   Prescriptions: TESSALON PERLES 100 MG CAPS (BENZONATATE) one cap by mouth three times a day as needed for cough  #30 x 0   Entered and Authorized by:   Lemont Fillers FNP   Signed by:   Lemont Fillers FNP on 02/28/2009   Method used:   Electronically to        [facility] (retail)       [address]       [place], [place]  [address]       Ph: [phone]       Fax: [phone]   RxID:   [id number] AUGMENTIN  500-125 MG TABS (AMOXICILLIN-POT CLAVULANATE) one to two tabs by mouth every 12 hours x 10 days  #20 x 0   Entered and Authorized by:   Lemont Fillers FNP   Signed by:   Lemont Fillers FNP on 02/28/2009   Method used:   Electronically to        [facility] (retail)       [address]       [place], [place]  [address]       Ph: [phone]       Fax: [phone]   RxID:   [id number]

## 2010-02-26 NOTE — Letter (Signed)
Summary: Health Exam Form/[facility]  Health Exam Form/[facility]   Imported By: Lanelle Bal 05/10/2009 14:14:08  _____________________________________________________________________  External Attachment:    Type:   Image     Comment:   External Document

## 2010-02-26 NOTE — Progress Notes (Signed)
Summary: ID appt.  Phone Note Call from Patient   Caller: Patient Call For: [phone] Summary of Call: Received voice message from pt stating that she has ID appointment on 8/31 @ [facility] office and she also received call from [place] that said she has appt with them on 8/31 as well.  Pt thinks the [facility] office has her records and wants to know if it is ok to cancel the appt. with [facility]?  Nicki Guadalajara Fergerson CMA Duncan Dull)  September 24, 2009 3:45 PM   Follow-up for Phone Call        plz cancel appt with [facility] Follow-up by: D. Thomos Lemons DO,  September 24, 2009 5:05 PM  Additional Follow-up for Phone Call Additional follow up Details #1::        call placed to patient, she was informed, call will be returned to [facility]. Appointment for 8/31 will be cancelled  [phone] 8a-5p- [facility]) Additional Follow-up by: Glendell Docker CMA,  September 24, 2009 5:11 PM    Additional Follow-up for Phone Call Additional follow up Details #2::    call placed to [facility], appointment for 09/26/09 has been cancelled Follow-up by: Glendell Docker CMA,  September 25, 2009 8:13 AM

## 2010-02-26 NOTE — Medication Information (Signed)
Summary: [facility]: RX  [facility]: RX   Imported By: Florinda Marker 10/03/2009 11:57:59  _____________________________________________________________________  External Attachment:    Type:   Image     Comment:   External Document
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
## 2010-02-26 NOTE — Consult Note (Signed)
Summary: [facility]  [facility]   Imported By: Lanelle Bal 10/25/2009 08:29:02  _____________________________________________________________________  External Attachment:    Type:   Image     Comment:   External Document

## 2010-02-26 NOTE — Progress Notes (Signed)
Summary: Acne Medication  Phone Note Call from Patient Call back at Home Phone [phone]   Caller: Patient Call For: D. Thomos Lemons DO Summary of Call: Patient called and left voice message stating she has taken acne medication in the past and would like to know if she could re-start the medication.  Initial call taken by: Glendell Docker CMA,  December 17, 2009 4:05 PM  Follow-up for Phone Call        pt can restart aldactone   pt needs bmet monitored q 4 months have pt come in within 1 month of restarting aldactone Follow-up by: D. Thomos Lemons DO,  December 17, 2009 5:48 PM  Additional Follow-up for Phone Call Additional follow up Details #1::        call returned to patient at [phone], no answer. A detailed voice message was left for patient informing her per Dr Artist Pais instructions. Lab work has been entered for the week of December 12th, 2011. Message was left for patient to call if any questions Additional Follow-up by: Glendell Docker CMA,  December 18, 2009 8:32 AM    New/Updated Medications: SPIRONOLACTONE 25 MG TABS (SPIRONOLACTONE) one by mouth once daily Prescriptions: SPIRONOLACTONE 25 MG TABS (SPIRONOLACTONE) one by mouth once daily  #90 x 0   Entered and Authorized by:   D. Thomos Lemons DO   Signed by:   D. Thomos Lemons DO on 12/17/2009   Method used:   Electronically to        [facility] (retail)       [address]       [place], [place]  [address]       Ph: [phone]       Fax: [phone]   RxID:   [id number]

## 2010-02-26 NOTE — Consult Note (Signed)
Summary: [facility]  [facility]   Imported By: Lanelle Bal 11/08/2009 12:25:10  _____________________________________________________________________  External Attachment:    Type:   Image     Comment:   External Document

## 2010-02-28 NOTE — Progress Notes (Signed)
Summary: Blood Work  Phone Note Call from Patient Call back at [employer] [phone]   Summary of Call: patient called and left voice message stating she needs to have blood work for the medication she is taking. She has a order for a BMET is there anything else that is needed? Initial call taken by: Glendell Docker CMA,  January 16, 2010 10:44 AM  Follow-up for Phone Call        no, just bmet Follow-up by: D. Thomos Lemons DO,  January 16, 2010 12:16 PM  Additional Follow-up for Phone Call Additional follow up Details #1::        call returned to patient at [phone], she states she already had blood drawn this morning, the order was already there.  Additional Follow-up by: Glendell Docker CMA,  January 16, 2010 1:57 PM

## 2010-02-28 NOTE — Progress Notes (Signed)
Summary: Lab Results  Phone Note Outgoing Call   Summary of Call: call pt - electrolytes and kidney function is normal Initial call taken by: D. Thomos Lemons DO,  January 17, 2010 5:22 PM  Follow-up for Phone Call        call placed to patient at [phone], no answer. A detailed voice message was left informing patient per Dr Artist Pais instructions Follow-up by: Glendell Docker CMA,  January 18, 2010 9:06 AM

## 2010-04-08 ENCOUNTER — Encounter: Payer: Self-pay | Admitting: *Deleted

## 2010-06-11 NOTE — Op Note (Signed)
NAME:  Holly Keller, Holly Keller NO.:  [id number]   MEDICAL RECORD NO.:  [id number]          PATIENT TYPE:  AMB   LOCATION:  SDC                           FACILITY:  [facility]   PHYSICIAN:  Freddy Finner, M.D.   DATE OF BIRTH:  1960/02/03   DATE OF PROCEDURE:  08/30/2007  DATE OF DISCHARGE:                               OPERATIVE REPORT   PREOPERATIVE DIAGNOSIS:  Missed abortion.   POSTOPERATIVE DIAGNOSIS:  Missed abortion.   OPERATION:  Dilation and aspiration.   BLOOD TYPE:  AB Positive.   SURGEON:  Freddy Finner, MD   ANESTHESIA:  General.   ESTIMATED INTRAOPERATIVE BLOOD LOSS:  20 mL.   INTRAOPERATIVE COMPLICATIONS:  None.   INDICATIONS FOR PROCEDURE:  The patient is a 50 year old with a poor  obstetric history who had a spontaenous conception after repeated  attempts at in vitro fertilization.  She was seen at approximately 8  weeks' gestation and had a 10 weeks pregnancy with a viable fetus.  She  presented approximately 3 days prior to this admission with a nonviable  pregnancy confirmed by ultrasound.  Her primary physician is Dr. Rana Snare  who is out of town this week.  She was schedule today for D and A.   PROCEDURE IN DETAIL:  She was brought to the operating room placed under  IV sedation, placed in the dorsal lithotomy position.  Betadine prep of  mons, perineum and vagina was carried out.  The cervix was visualized  using a bivalve speculum.  Cervix was grasped in the anterior lip with a  single-tooth tenaculum.  Cervix was easily dilated to 27 with Pratt and  8-mm suction cannula was introduced and aspiration produced obvious  products of conception.  This was continued until we felt the cavity was  evacuated.  Gentle sharp curettage and exploration with Randall stone  forceps was carried out, followed by repeat vacuum aspiration.  At last,  at this point we felt the cavity was completely evacuated.  The  procedure was terminated.  The patient was awakened  and taken to  recovery in good condition.  She has a followup from a schedule with Dr.  Rana Snare in approximately 2 weeks.  She was given Darvocet to be taken as  needed for postoperative pain unrelieved by ibuprofen.      Freddy Finner, M.D.  Electronically Signed    WRN/MEDQ  D:  08/30/2007  T:  08/31/2007  Job:  [id number]

## 2010-06-14 NOTE — Op Note (Signed)
NAME:  Holly Keller, Holly Keller NO.:  [id number]   MEDICAL RECORD NO.:  [id number]          PATIENT TYPE:  AMB   LOCATION:  SDC                           FACILITY:  [facility]   PHYSICIAN:  Dineen Kid. Rana Snare, M.D.    DATE OF BIRTH:  [birth year]   DATE OF PROCEDURE:  03/05/2004  DATE OF DISCHARGE:                                 OPERATIVE REPORT   PREOPERATIVE DIAGNOSES:  1.  Abnormal uterine bleeding.  2.  Endometrial mass.   POSTOPERATIVE DIAGNOSES:  1.  Abnormal uterine bleeding.  2.  Endometrial mass.  3.  Endometrial polyp.   PROCEDURE:  Hysteroscopy, dilatation and curettage, with polypectomy.   SURGEON:  Dineen Kid. Rana Snare, M.D.   ANESTHESIA:  Monitored anesthetic care and paracervical block.   INDICATIONS:  [name] is a 50 year old woman with a sonohysterogram for  evaluation of an endometrial mass that shows a large endometrial polyp.  She  presents today for hysteroscopy, D&C, for removal of this endometrial polyp.  The risks and benefits were discussed at length.  Informed consent was  obtained.   Findings at the time of surgery were a large endometrial polyp arising from  the posterior endometrial wall, normal-appearing ostia, normal-appearing  cervix.   DESCRIPTION OF PROCEDURE:  After adequate analgesia, the patient was placed  in the dorsal lithotomy position.  She was sterilely prepped and draped.  The bladder was sterilely drained.  A Graves speculum was placed.  A  paracervical block was placed with 1% Xylocaine and 1:100,000 epinephrine 20  mL total used.  Tenaculum placed on the anterior lip of the cervix.  The  uterus was sounded to 8 cm.  The cervix was then dilated to a #27 Pratt  dilator.  The hysteroscope was inserted and the above findings were noted.  The polyp forceps were used to grasp the polyp and it was retrieved in  fragments.  It was followed by gentle sharp curettage, retrieving small  fragments of the endometrial polyp.  A gritty surface was  felt throughout  the endometrial cavity.  Follow-up examination with the hysteroscope  revealed the entirety of the polyp had been removed, normal-appearing ostia,  and otherwise normal-appearing endometrial cavity and cervix at this time.  The hysteroscope was then removed, the tenaculum removed from the cervix,  noted to be hemostatic.  The speculum was then removed and the patient was  transferred to the recovery room in stable condition.  The patient received  1 g of Rocephin preoperatively, Toradol 30 mg intraoperatively, and the  sorbitol deficit was 60 mL total.  Estimated blood loss was minimal.   DISPOSITION:  The patient will be discharged home, will follow up in the  office in two to three weeks.  Sent home with a routine instruction sheet  for D&C.  Told to return for increased pain, fever or bleeding.      DCL/MEDQ  D:  03/05/2004  T:  03/05/2004  Job:  [id number]

## 2010-08-23 ENCOUNTER — Other Ambulatory Visit (HOSPITAL_BASED_OUTPATIENT_CLINIC_OR_DEPARTMENT_OTHER): Payer: Self-pay | Admitting: Obstetrics and Gynecology

## 2010-08-23 DIAGNOSIS — Z1231 Encounter for screening mammogram for malignant neoplasm of breast: Secondary | ICD-10-CM

## 2010-08-28 ENCOUNTER — Ambulatory Visit (HOSPITAL_BASED_OUTPATIENT_CLINIC_OR_DEPARTMENT_OTHER)
Admission: RE | Admit: 2010-08-28 | Discharge: 2010-08-28 | Disposition: A | Discharge status: Home / Self Care | Payer: Private Health Insurance - Indemnity | Source: Ambulatory Visit | Attending: Obstetrics and Gynecology | Admitting: Obstetrics and Gynecology

## 2010-08-28 DIAGNOSIS — Z1231 Encounter for screening mammogram for malignant neoplasm of breast: Secondary | ICD-10-CM | POA: Insufficient documentation

## 2010-10-25 LAB — CBC
MCV: 97.2
RBC: 3.91
WBC: 8.5

## 2011-11-07 ENCOUNTER — Other Ambulatory Visit (HOSPITAL_BASED_OUTPATIENT_CLINIC_OR_DEPARTMENT_OTHER): Payer: Self-pay | Admitting: Obstetrics and Gynecology

## 2011-11-07 DIAGNOSIS — Z1231 Encounter for screening mammogram for malignant neoplasm of breast: Secondary | ICD-10-CM

## 2011-11-26 ENCOUNTER — Ambulatory Visit (HOSPITAL_BASED_OUTPATIENT_CLINIC_OR_DEPARTMENT_OTHER)
Admission: RE | Admit: 2011-11-26 | Discharge: 2011-11-26 | Disposition: A | Discharge status: Home / Self Care | Payer: BC Managed Care – PPO | Source: Ambulatory Visit | Attending: Obstetrics and Gynecology | Admitting: Obstetrics and Gynecology

## 2011-11-26 DIAGNOSIS — Z1231 Encounter for screening mammogram for malignant neoplasm of breast: Secondary | ICD-10-CM

## 2012-02-11 ENCOUNTER — Ambulatory Visit (HOSPITAL_BASED_OUTPATIENT_CLINIC_OR_DEPARTMENT_OTHER)
Admission: RE | Admit: 2012-02-11 | Discharge: 2012-02-11 | Disposition: A | Discharge status: Home / Self Care | Payer: BC Managed Care – PPO | Source: Ambulatory Visit | Attending: Family | Admitting: Family

## 2012-02-11 ENCOUNTER — Encounter: Payer: Self-pay | Admitting: Family

## 2012-02-11 ENCOUNTER — Ambulatory Visit (INDEPENDENT_AMBULATORY_CARE_PROVIDER_SITE_OTHER): Payer: BC Managed Care – PPO | Admitting: Family

## 2012-02-11 VITALS — BP 112/70 | HR 60 | Temp 98.5°F | Resp 16 | Wt 163.1 lb

## 2012-02-11 DIAGNOSIS — R059 Cough, unspecified: Secondary | ICD-10-CM

## 2012-02-11 DIAGNOSIS — M25519 Pain in unspecified shoulder: Secondary | ICD-10-CM

## 2012-02-11 DIAGNOSIS — J101 Influenza due to other identified influenza virus with other respiratory manifestations: Secondary | ICD-10-CM | POA: Insufficient documentation

## 2012-02-11 DIAGNOSIS — J189 Pneumonia, unspecified organism: Secondary | ICD-10-CM | POA: Insufficient documentation

## 2012-02-11 DIAGNOSIS — R05 Cough: Secondary | ICD-10-CM

## 2012-02-11 LAB — CBC WITH DIFFERENTIAL/PLATELET
HCT: 39.9 % (ref 36.0–46.0)
Hemoglobin: 13.7 g/dL (ref 12.0–15.0)
Lymphocytes Relative: 8 % — ABNORMAL LOW (ref 12–46)
Monocytes Absolute: 1.1 10*3/uL — ABNORMAL HIGH (ref 0.1–1.0)
Monocytes Relative: 14 % — ABNORMAL HIGH (ref 3–12)
Neutro Abs: 6.2 10*3/uL (ref 1.7–7.7)
WBC: 7.9 10*3/uL (ref 4.0–10.5)

## 2012-02-11 LAB — HEPATIC FUNCTION PANEL
Albumin: 4.3 g/dL (ref 3.5–5.2)
Alkaline Phosphatase: 58 U/L (ref 39–117)
Bilirubin, Direct: 0.1 mg/dL (ref 0.0–0.3)
Indirect Bilirubin: 0.3 mg/dL (ref 0.0–0.9)

## 2012-02-11 LAB — POCT INFLUENZA A/B: Influenza B, POC: NEGATIVE

## 2012-02-11 MED ORDER — MELOXICAM 7.5 MG PO TABS: 7.5000 mg | ORAL_TABLET | Freq: Every day | ORAL | Status: DC

## 2012-02-11 MED ORDER — OSELTAMIVIR PHOSPHATE 75 MG PO CAPS: 75.0000 mg | ORAL_CAPSULE | Freq: Two times a day (BID) | ORAL | Status: DC

## 2012-02-11 NOTE — Patient Instructions (Addendum)
Please resume daily aspirin.   Call if pain worsens or if no improvement in 2 weeks.  Complete your x-ray on the first floor.

## 2012-02-11 NOTE — Progress Notes (Signed)
  Subjective:    Patient ID: Holly Keller, female    DOB: 02-06-1960, 52 y.o.   MRN: [id number]  HPI [name] is a 52 yr old female who presents today with chief complaint of right posterior shoulder pain.  She reports that the pain radiates into the right arm. Denies neck pain.  She has been working a lot on computer and went to the gym yesterday.  Feels like someone is stabbing behind the right shoulder. Husband recently had a viral illness.  She denies fever.  Symptoms started last Thursday. Pain is worse with reaching to wipe on the toilet.  Pain is improved since she took aleve this AM.    Reports that she had a staph infection overlying her right clavicle in 2011. Pt has hx of MRSA sepsis per chart review. She reports that she had a picc line after that.  She saw ID in [place].  Did 5 weeks iv abx, followed by several weeks of oral.  She is concerned that this pain could be recurrent infection or "worse- like cancer."  Review of Systems    see HPI  No past medical history on file.  History   Social History  . Marital Status: Married    Spouse Name: N/A    Number of Children: N/A  . Years of Education: N/A   Occupational History  . Not on file.   Social History Main Topics  . Smoking status: Current Every Day Smoker    Types: Cigarettes  . Smokeless tobacco: Never Used     Comment: 6 cigarettes daily  . Alcohol Use: Not on file  . Drug Use: Not on file  . Sexually Active: Not on file   Other Topics Concern  . Not on file   Social History Narrative  . No narrative on file    No past surgical history on file.  No family history on file.  No Known Allergies  Current Outpatient Prescriptions on File Prior to Visit  Medication Sig Dispense Refill  . aspirin (ECOTRIN LOW STRENGTH) 81 MG EC tablet Take 81 mg by mouth daily.          BP 112/70  Pulse 60  Temp 98.5 F (36.9 C) (Oral)  Resp 16  Wt 163 lb 1.9 oz (73.991 kg)  SpO2 97%    Objective:   Physical  Exam  Constitutional: She is oriented to person, place, and time. She appears well-developed and well-nourished.  Cardiovascular: Normal rate and regular rhythm.   No murmur heard. Pulmonary/Chest: Effort normal and breath sounds normal. No respiratory distress. She has no wheezes. She has no rales. She exhibits no tenderness.  Musculoskeletal: She exhibits no edema.  Lymphadenopathy:    She has no cervical adenopathy.  Neurological: She is alert and oriented to person, place, and time.  Skin: Skin is warm.  Psychiatric: She has a normal mood and affect. Her behavior is normal. Judgment and thought content normal.          Assessment & Plan:  Shoulder pain- likely musculoskeletal.  Obtain X ray of shoulder. She is concerned about possibility of recurrent mrsa infection.  Unlikely.  CRP and CBC obtained prior to flu results.  Likely to be abnormal in setting of flu.  Trial of meloxicam for shoulder pain.  Influenza- husband was sick.  Flu swab positive for influenza A. Rx with tamiflu.

## 2012-02-16 ENCOUNTER — Telehealth: Payer: Self-pay | Admitting: Family

## 2012-02-16 LAB — C-REACTIVE PROTEIN: CRP: 3.9 mg/dL — ABNORMAL HIGH

## 2012-02-16 NOTE — Telephone Encounter (Signed)
Spoke to Lonoke at [facility], she states "it" was not received in the lab yet and we should give it 1 more day. I questioned if they could add test to specimens already submitted from 02/11/12. She states she is sending an email to the lab to add the test and a result should be received within 24 hours.

## 2012-02-16 NOTE — Telephone Encounter (Signed)
CRP elevated, but likely due to her having flu.

## 2012-02-16 NOTE — Telephone Encounter (Signed)
Please call patient and let her know LFT/CBC look good. CXR normal. Shoulder x ray shows some arthritis, no sign of fracture or infection. CRP still pending.  Could you pls check status of CRP? thanks

## 2012-02-17 NOTE — Telephone Encounter (Signed)
Notified pt and she voices understanding. 

## 2012-02-18 ENCOUNTER — Encounter: Payer: Self-pay | Admitting: Family

## 2012-02-18 ENCOUNTER — Ambulatory Visit (INDEPENDENT_AMBULATORY_CARE_PROVIDER_SITE_OTHER): Payer: BC Managed Care – PPO | Admitting: Family

## 2012-02-18 VITALS — BP 116/74 | HR 50 | Temp 98.4°F | Resp 16 | Ht 64.5 in | Wt 165.0 lb

## 2012-02-18 DIAGNOSIS — Z23 Encounter for immunization: Secondary | ICD-10-CM

## 2012-02-18 DIAGNOSIS — Z0000 Encounter for general adult medical examination without abnormal findings: Secondary | ICD-10-CM

## 2012-02-18 NOTE — Patient Instructions (Addendum)
Please return fasting for blood work.

## 2012-02-18 NOTE — Progress Notes (Signed)
  Subjective:    Patient ID: Holly Keller, female    DOB: [birth year], 52 y.o.   MRN: [id number]  HPI  Patient presents today for complete physical.  Immunizations: Requests flu shot today. Diet:  Trying to lose weight.   Exercise: Reports that she is an "off/on" runner, using stationary bike 20 minutes a day. Colonoscopy: last colo 3-4 yrs ago.  She was told to repeat in 5 years.   Dexa: has never had bone density Pap Smear: per GYN Mammogram:11/26/11   Review of Systems  Constitutional: Negative for unexpected weight change.  HENT: Negative for congestion.   Eyes: Negative for visual disturbance.  Respiratory: Positive for cough.   Cardiovascular: Negative for leg swelling.  Gastrointestinal: Negative for blood in stool.  Genitourinary: Negative for dysuria and frequency.  Musculoskeletal:       R shoulder discomfort  Skin: Negative for rash.  Neurological: Negative for headaches.  Hematological: Negative for adenopathy.  Psychiatric/Behavioral:       Denies depression/anxiety       Objective:   Physical Exam  Physical Exam  Constitutional: She is oriented to person, place, and time. She appears well-developed and well-nourished. No distress.  HENT:  Head: Normocephalic and atraumatic.  Right Ear: Tympanic membrane and ear canal normal.  Left Ear: Tympanic membrane and ear canal normal.  Mouth/Throat: Oropharynx is clear and moist.  Eyes: Pupils are equal, round, and reactive to light. No scleral icterus.  Neck: Normal range of motion. No thyromegaly present.  Cardiovascular: Normal rate and regular rhythm.   No murmur heard. Pulmonary/Chest: Effort normal and breath sounds normal. No respiratory distress. He has no wheezes. She has no rales. She exhibits no tenderness.  Abdominal: Soft. Bowel sounds are normal. He exhibits no distension and no mass. There is no tenderness. There is no rebound and no guarding.  Musculoskeletal: She exhibits no edema.    Lymphadenopathy:    She has no cervical adenopathy.  Neurological: She is alert and oriented to person, place, and time. She exhibits normal muscle tone. Coordination normal.  Skin: Skin is warm and dry.  Psychiatric: She has a normal mood and affect. Her behavior is normal. Judgment and thought content normal.  Breast/pelvic- deferred to GYN.          Assessment & Plan:    V70.0- patient counseled on healthy diet/exercise- flu shot today. She will return fasting for lab work.     Assessment & Plan:

## 2013-02-11 ENCOUNTER — Other Ambulatory Visit (HOSPITAL_BASED_OUTPATIENT_CLINIC_OR_DEPARTMENT_OTHER): Payer: Self-pay | Admitting: Obstetrics and Gynecology

## 2013-02-11 DIAGNOSIS — Z1231 Encounter for screening mammogram for malignant neoplasm of breast: Secondary | ICD-10-CM

## 2013-02-21 ENCOUNTER — Ambulatory Visit (HOSPITAL_BASED_OUTPATIENT_CLINIC_OR_DEPARTMENT_OTHER)
Admission: RE | Admit: 2013-02-21 | Discharge: 2013-02-21 | Disposition: A | Discharge status: Home / Self Care | Payer: BC Managed Care – PPO | Source: Ambulatory Visit | Attending: Obstetrics and Gynecology | Admitting: Obstetrics and Gynecology

## 2013-02-21 DIAGNOSIS — Z1231 Encounter for screening mammogram for malignant neoplasm of breast: Secondary | ICD-10-CM | POA: Insufficient documentation

## 2013-02-23 ENCOUNTER — Other Ambulatory Visit: Payer: Self-pay | Admitting: Obstetrics and Gynecology

## 2013-02-23 DIAGNOSIS — R928 Other abnormal and inconclusive findings on diagnostic imaging of breast: Secondary | ICD-10-CM

## 2013-03-03 ENCOUNTER — Other Ambulatory Visit: Payer: BC Managed Care – PPO

## 2013-03-04 ENCOUNTER — Ambulatory Visit
Admission: RE | Admit: 2013-03-04 | Discharge: 2013-03-04 | Disposition: A | Discharge status: Home / Self Care | Payer: BC Managed Care – PPO | Source: Ambulatory Visit | Attending: Obstetrics and Gynecology | Admitting: Obstetrics and Gynecology

## 2013-03-04 DIAGNOSIS — R928 Other abnormal and inconclusive findings on diagnostic imaging of breast: Secondary | ICD-10-CM

## 2013-08-15 ENCOUNTER — Other Ambulatory Visit: Payer: Self-pay | Admitting: Obstetrics and Gynecology

## 2013-08-15 DIAGNOSIS — N63 Unspecified lump in unspecified breast: Secondary | ICD-10-CM

## 2013-09-09 ENCOUNTER — Ambulatory Visit
Admission: RE | Admit: 2013-09-09 | Discharge: 2013-09-09 | Disposition: A | Discharge status: Home / Self Care | Payer: BC Managed Care – PPO | Source: Ambulatory Visit | Attending: Obstetrics and Gynecology | Admitting: Obstetrics and Gynecology

## 2013-09-09 ENCOUNTER — Encounter (INDEPENDENT_AMBULATORY_CARE_PROVIDER_SITE_OTHER): Payer: Self-pay

## 2013-09-09 DIAGNOSIS — N63 Unspecified lump in unspecified breast: Secondary | ICD-10-CM

## 2014-04-19 ENCOUNTER — Other Ambulatory Visit: Payer: Self-pay | Admitting: Obstetrics and Gynecology

## 2014-04-19 DIAGNOSIS — N6320 Unspecified lump in the left breast, unspecified quadrant: Secondary | ICD-10-CM

## 2014-05-05 ENCOUNTER — Other Ambulatory Visit: Payer: BC Managed Care – PPO

## 2014-05-12 ENCOUNTER — Other Ambulatory Visit: Payer: BC Managed Care – PPO

## 2014-05-18 ENCOUNTER — Other Ambulatory Visit: Payer: BC Managed Care – PPO

## 2014-05-19 ENCOUNTER — Other Ambulatory Visit: Payer: BC Managed Care – PPO

## 2014-05-26 ENCOUNTER — Ambulatory Visit
Admission: RE | Admit: 2014-05-26 | Discharge: 2014-05-26 | Disposition: A | Discharge status: Home / Self Care | Payer: BC Managed Care – PPO | Source: Ambulatory Visit | Attending: Obstetrics and Gynecology | Admitting: Obstetrics and Gynecology

## 2014-05-26 DIAGNOSIS — N6320 Unspecified lump in the left breast, unspecified quadrant: Secondary | ICD-10-CM

## 2015-01-28 DIAGNOSIS — R001 Bradycardia, unspecified: Secondary | ICD-10-CM

## 2015-01-28 HISTORY — DX: Bradycardia, unspecified: R00.1

## 2015-06-04 ENCOUNTER — Other Ambulatory Visit: Payer: Self-pay | Admitting: Obstetrics and Gynecology

## 2015-06-04 DIAGNOSIS — N6320 Unspecified lump in the left breast, unspecified quadrant: Secondary | ICD-10-CM

## 2015-06-05 ENCOUNTER — Other Ambulatory Visit: Payer: Self-pay | Admitting: Obstetrics and Gynecology

## 2015-06-05 DIAGNOSIS — Z780 Asymptomatic menopausal state: Secondary | ICD-10-CM

## 2015-06-26 ENCOUNTER — Ambulatory Visit (HOSPITAL_BASED_OUTPATIENT_CLINIC_OR_DEPARTMENT_OTHER)
Admission: RE | Admit: 2015-06-26 | Discharge: 2015-06-26 | Disposition: A | Discharge status: Home / Self Care | Payer: BC Managed Care – PPO | Source: Ambulatory Visit | Attending: Medical | Admitting: Medical

## 2015-06-26 ENCOUNTER — Telehealth: Payer: Self-pay | Admitting: Family

## 2015-06-26 ENCOUNTER — Ambulatory Visit: Payer: BC Managed Care – PPO | Admitting: Medical

## 2015-06-26 ENCOUNTER — Ambulatory Visit (INDEPENDENT_AMBULATORY_CARE_PROVIDER_SITE_OTHER): Payer: BC Managed Care – PPO | Admitting: Medical

## 2015-06-26 ENCOUNTER — Encounter: Payer: Self-pay | Admitting: Medical

## 2015-06-26 VITALS — BP 110/74 | HR 44 | Temp 98.1°F | Ht 64.5 in | Wt 181.8 lb

## 2015-06-26 DIAGNOSIS — Z9889 Other specified postprocedural states: Secondary | ICD-10-CM

## 2015-06-26 DIAGNOSIS — M542 Cervicalgia: Secondary | ICD-10-CM

## 2015-06-26 DIAGNOSIS — S46811A Strain of other muscles, fascia and tendons at shoulder and upper arm level, right arm, initial encounter: Secondary | ICD-10-CM

## 2015-06-26 MED ORDER — DICLOFENAC SODIUM 75 MG PO TBEC: 75.0000 mg | DELAYED_RELEASE_TABLET | Freq: Two times a day (BID) | ORAL | Status: DC

## 2015-06-26 NOTE — Progress Notes (Signed)
Subjective:    Patient ID: Holly Keller, female    DOB: [birth year], 55 y.o.   MRN: [id number]  HPI   Pt in with neck pain. Started around one month ago. She remembers a twinge after moving stations at work. Some pain in both trapezius muscles. Pt has pain at base of rt side of her neck and some pain toward her shoulder in trapezius on rt side.No pain that shoots down her arms.    Pt gives remote history of previous skin infection that was determined to be staph in her rt upper chest area. Pt describes infection was under clavicle. She needed antibiotics IV for that.  The other day base of her neck rt side felt tearing sensation at rt side of neck. Near region of prior infection 6 years ago.  Pt thought rt side neck pain work station related but she has been on vacation and pain is persisting.   Review of Systems  Constitutional: Negative for fever, chills and fatigue.       But some sweats/hotflashes related to possible menopause.  HENT: Negative for congestion, dental problem, postnasal drip, sinus pressure and sore throat.   Respiratory: Negative for cough, chest tightness, shortness of breath and wheezing.   Cardiovascular: Negative for chest pain and palpitations.  Endocrine:       Hotflashes.  Musculoskeletal:       See hpi.  Neurological: Negative for dizziness, light-headedness and headaches.  Hematological: Negative for adenopathy. Does not bruise/bleed easily.  Psychiatric/Behavioral: Negative for behavioral problems, confusion and agitation.   No past medical history on file.   Social History   Social History  . Marital Status: Married    Spouse Name: N/A  . Number of Children: N/A  . Years of Education: N/A   Occupational History  . Not on file.   Social History Main Topics  . Smoking status: Current Every Day Smoker    Types: Cigarettes  . Smokeless tobacco: Never Used     Comment: 6 cigarettes daily  . Alcohol Use: Not on file  . Drug Use: Not on file   . Sexual Activity: Not on file   Other Topics Concern  . Not on file   Social History Narrative    No past surgical history on file.  No family history on file.  No Known Allergies  No current outpatient prescriptions on file prior to visit.   No current facility-administered medications on file prior to visit.    BP 110/74 mmHg  Pulse 44  Temp(Src) 98.1 F (36.7 C) (Oral)  Ht 5' 4.5" (1.638 m)  Wt 181 lb 12.8 oz (82.464 kg)  BMI 30.74 kg/m2  SpO2 98%      Objective:   Physical Exam  General- No acute distress. Pleasant patient. Neck- Full range of motion, no jvd Rt side of neck mild tender to palpation distal aspect just adjacent to rt shoulder. But otherwise trapezius non tender. Anterior aspect of the neck no thyromegaly. No carotid bruits. No lymphadenopathy or swelling. She points to base of neck lateral to sternocleidomastoid as area where she felt tearing. Lungs- Clear, even and unlabored. Heart- regular rate and rhythm. Neurologic- CNII- XII grossly intact.       Assessment & Plan:  For  trapezius strain will rx diclofenac.  For you neck pain and tearing sensation rt side of the neck,  we will get US of the neck region. I think [place] would be helpful in light of your prior  history of abscess around this area.  Also will get cbc to assess wbc count.  If cbc and US normal and pain in trapezius persists then will refer to sports medicine.

## 2015-06-26 NOTE — Progress Notes (Signed)
Pre visit review using our clinic review tool, if applicable. No additional management support is needed unless otherwise documented below in the visit note. 

## 2015-06-26 NOTE — Telephone Encounter (Signed)
contact regarding imaging results. [facility] # [phone]. US study was negative.

## 2015-06-26 NOTE — Patient Instructions (Addendum)
For  trapezius strain will rx diclofenac.  For you neck pain and tearing sensation rt side of the neck, we will get US of the neck region. I think US would be helpful in light of your prior history of abscess around this area.  Also will get cbc to assess wbc count.  If cbc and US normal and pain in trapezius persists then will refer to sports medicine.  Follow up in 7 days or as needed

## 2015-06-26 NOTE — Telephone Encounter (Signed)
Patient was advised PCP will contact regarding imaging results. [facility] # [phone]

## 2015-06-27 LAB — CBC WITH DIFFERENTIAL/PLATELET
Basophils Absolute: 0 10*3/uL (ref 0.0–0.1)
Basophils Relative: 0.7 % (ref 0.0–3.0)
EOS ABS: 0.1 10*3/uL (ref 0.0–0.7)
EOS PCT: 2.4 % (ref 0.0–5.0)
HCT: 39.6 % (ref 36.0–46.0)
HEMOGLOBIN: 13.3 g/dL (ref 12.0–15.0)
Lymphocytes Relative: 23.5 % (ref 12.0–46.0)
Lymphs Abs: 1.4 10*3/uL (ref 0.7–4.0)
MCHC: 33.6 g/dL (ref 30.0–36.0)
MCV: 96.6 fl (ref 78.0–100.0)
Monocytes Absolute: 0.6 10*3/uL (ref 0.1–1.0)
Monocytes Relative: 10.4 % (ref 3.0–12.0)
Neutro Abs: 3.8 10*3/uL (ref 1.4–7.7)
Neutrophils Relative %: 63 % (ref 43.0–77.0)
PLATELETS: 220 10*3/uL (ref 150.0–400.0)
RBC: 4.1 Mil/uL (ref 3.87–5.11)
RDW: 12.4 % (ref 11.5–15.5)
WBC: 6.1 10*3/uL (ref 4.0–10.5)

## 2015-06-27 MED ORDER — CYCLOBENZAPRINE HCL 5 MG PO TABS: 5.0000 mg | ORAL_TABLET | Freq: Every day | ORAL | Status: DC

## 2015-06-27 NOTE — Telephone Encounter (Signed)
Spoke with pt and voices understanding. Pt wanted to know what she could take in the interim for the pain or does she need an antiinflammatory. Pt did not have any further questions. Please advise.

## 2015-06-27 NOTE — Telephone Encounter (Signed)
Diclofenac is antiinflammatory . If she want a muscle relaxant at night could rx. Will send in low dose flexeril. But advise to take diclofenac. No otc nsaids while on diclofenac.

## 2015-06-27 NOTE — Telephone Encounter (Signed)
Tylenol use ok.

## 2015-06-27 NOTE — Telephone Encounter (Signed)
Spoke with pt and she did not want another medication such as flexeril. Pt states that she will try the diclofenac at this time. She wants to try tylenol in between the diclofenac doses.

## 2015-06-28 ENCOUNTER — Ambulatory Visit
Admission: RE | Admit: 2015-06-28 | Discharge: 2015-06-28 | Disposition: A | Discharge status: Home / Self Care | Payer: BC Managed Care – PPO | Source: Ambulatory Visit | Attending: Obstetrics and Gynecology | Admitting: Obstetrics and Gynecology

## 2015-06-28 DIAGNOSIS — Z780 Asymptomatic menopausal state: Secondary | ICD-10-CM

## 2015-06-28 DIAGNOSIS — N6320 Unspecified lump in the left breast, unspecified quadrant: Secondary | ICD-10-CM

## 2015-12-27 ENCOUNTER — Other Ambulatory Visit: Payer: Self-pay | Admitting: Urology

## 2016-01-11 ENCOUNTER — Encounter: Payer: Self-pay | Admitting: Family Medicine

## 2016-01-11 ENCOUNTER — Ambulatory Visit (INDEPENDENT_AMBULATORY_CARE_PROVIDER_SITE_OTHER): Payer: BC Managed Care – PPO | Admitting: Family Medicine

## 2016-01-11 VITALS — BP 146/99 | HR 89 | Temp 98.0°F | Resp 20 | Wt 189.0 lb

## 2016-01-11 DIAGNOSIS — R0981 Nasal congestion: Secondary | ICD-10-CM | POA: Diagnosis not present

## 2016-01-11 DIAGNOSIS — R112 Nausea with vomiting, unspecified: Secondary | ICD-10-CM

## 2016-01-11 MED ORDER — ONDANSETRON HCL 4 MG PO TABS: 4.0000 mg | ORAL_TABLET | Freq: Three times a day (TID) | ORAL | 0 refills | Status: DC | PRN

## 2016-01-11 NOTE — Progress Notes (Signed)
Holly Keller , 1960/12/15, 55 y.o., female MRN: [id number] Patient Care Team    Relationship Specialty Notifications Start End  Sandford CrazeMelissa O'Sullivan, NP PCP - General Internal Medicine  02/11/12     CC: flu-like symptoms  Subjective: Pt presents for an acute OV with complaints of Flulike symptoms of less than 1 day duration.  Patient states she had some mild chest congestion this morning. She had concerns mostly because she is scheduled for right nephrectomy this coming Wednesday, secondary to newly found kidney cancer. She called her doctor's office with these concerns, and they encouraged her to start Mucinex to help with her symptoms. She states she took the Mucinex cold and sore throat preparation. She reported to work and had the office Christmas party, and then had a bout of nausea. She states she immediately went home with concerns of having the flu, and vomited (see in her yard before getting in the door) and then had an episode of diarrhea. Patient states since that has occurred, she feels much improved and almost back to normal. She is going to make sure she did not have the flu going into the weekend, especially with her upcoming surgery next week.   No Known Allergies Social History  Substance Use Topics  . Smoking status: Former Smoker    Types: Cigarettes    Quit date: 01/27/2013  . Smokeless tobacco: Never Used     Comment: 6 cigarettes daily  . Alcohol use 4.2 oz/week    7 Glasses of wine per week   History reviewed. No pertinent past medical history. History reviewed. No pertinent surgical history. History reviewed. No pertinent family history. Allergies as of 01/11/2016   No Known Allergies     Medication List       Accurate as of 01/11/16  4:03 PM. Always use your most recent med list.          guaiFENesin 600 MG 12 hr tablet Commonly known as:  MUCINEX Take 600-1,200 mg by mouth 2 (two) times daily as needed (for cough/cold symptoms.).       No results  found for this or any previous visit (from the past 24 hour(s)). No results found.   ROS: Negative, with the exception of above mentioned in HPI   Objective:  BP (!) 146/99 (BP Location: Left Arm, Patient Position: Sitting, Cuff Size: Large)   Pulse 89   Temp 98 F (36.7 C)   Resp 20   Wt 189 lb (85.7 kg)   SpO2 98%   BMI 31.94 kg/m  Body mass index is 31.94 kg/m. Gen: Afebrile. No acute distress. Nontoxic in appearance, well developed, well nourished. Pleasant Caucasian female. HENT: AT. [place]. Bilateral TM visualized within normal limits. MMM, no oral lesions. Bilateral nares very mild erythema, very mild swelling. Throat without erythema or exudates. No cough, no hoarseness, no tenderness to palpation maxillary sinus. No drainage. Eyes:Pupils Equal Round Reactive to light, Extraocular movements intact,  Conjunctiva without redness, discharge or icterus. Neck/lymp/endocrine: Supple, no lymphadenopathy CV: RRR  Chest: CTAB, no wheeze or crackles. Good air movement, normal resp effort.  Neuro:  Normal gait. PERLA. EOMi. Alert. Oriented x3   Assessment/Plan: Holly Keller is a 55 y.o. female present for acute OV for  Nausea and vomiting, intractability of vomiting not specified, unspecified vomiting type Nasal congestion - Discussed with patient unknown etiology of nausea/vomiting/diarrhea. Her influenza test was negative today. Possibly could be gastroenteritis versus food poisoning. Patient appears well on exam today.  She has some mild nausea, therefore prescribe Zofran for her. Discussed she will need to rest a maintain adequate hydration throughout the weekend. -Her congestion does not appear to be infectious. Possibly could be early viral. Regardless antibiotics would not be beneficial at this time. Discussed with her over-the-counter regimens which should be safe for her prior to her surgery. She is to discontinue the current Mucinex cold she is using as it has a decongestant in it  and her blood pressure is elevated today. She is to use plain Mucinex and Flonase. -Discussed with her that if her symptoms are worsening over the weekend, she should be seen immediately either in the walk-in clinic or an urgent care.     electronically signed by:  Felix Pacinienee Kynzi Levay, DO  [facility] [place]

## 2016-01-11 NOTE — Patient Instructions (Addendum)
Holly Keller  01/11/2016   Your procedure is scheduled on: 01-16-16  [facility]  [facility] to 3rd floor to  Short Stay Center at 1045 AM.  Call this number if you have problems the morning of surgery [phone]   Remember: ONLY 1 PERSON MAY GO WITH YOU TO SHORT STAY TO GET  READY MORNING OF YOUR SURGERY.  Do not eat food  :After Midnight Monday NIGHT CLEAR LIQUIDS ALL DAY Tuesday 01-15-16 PER DR HERRICK INSTRUCTIONS, NOTHING BY MOUTH AFTER MIDNIGHT Tuesday NIGHT. FOLLOW ALL DR Marlou PorchHERRICK BOWEL PREP INSTRUCTIONS.     Take these medicines the morning of surgery with A SIP OF WATER: none                               You may not have any metal on your body including hair pins and              piercings  Do not wear jewelry, make-up, lotions, powders or perfumes, deodorant             Do not wear nail polish.  Do not shave  48 hours prior to surgery.              Men may shave face and neck.   Do not bring valuables to the hospital. [facility] IS NOT             RESPONSIBLE   FOR VALUABLES.  Contacts, dentures or bridgework may not be worn into surgery.  Leave suitcase in the car. After surgery it may be brought to your room.     Special Instructions: N/A              Please read over the following fact sheets you were given: _____________________________________________________________________                CLEAR LIQUID DIET   Foods Allowed                                                                     Foods Excluded  Coffee and tea, regular and decaf                             liquids that you cannot  Plain Jell-O in any flavor                                             see through such as: Fruit ices (not with fruit pulp)                                     milk, soups, orange juice  Iced Popsicles  All solid food Carbonated beverages, regular and diet                                     Cranberry, grape and apple juices Sports drinks like Gatorade Lightly seasoned clear broth or consume(fat free) Sugar, honey syrup  Sample Menu Breakfast                                Lunch                                     Supper Cranberry juice                    Beef broth                            Chicken broth Jell-O                                     Grape juice                           Apple juice Coffee or tea                        Jell-O                                      Popsicle                                                Coffee or tea                        Coffee or tea  _____________________________________________________________________  [facility] - Preparing for Surgery Before surgery, you can play an important role.  Because skin is not sterile, your skin needs to be as free of germs as possible.  You can reduce the number of germs on your skin by washing with CHG (chlorahexidine gluconate) soap before surgery.  CHG is an antiseptic cleaner which kills germs and bonds with the skin to continue killing germs even after washing. Please DO NOT use if you have an allergy to CHG or antibacterial soaps.  If your skin becomes reddened/irritated stop using the CHG and inform your nurse when you arrive at Short Stay. Do not shave (including legs and underarms) for at least 48 hours prior to the first CHG shower.  You may shave your face/neck. Please follow these instructions carefully:  1.  Shower with CHG Soap the night before surgery and the  morning of Surgery.  2.  If you choose to wash your hair, wash your hair first as usual with your  normal  shampoo.  3.  After you shampoo, rinse your hair and body thoroughly to remove the  shampoo.  4.  Use CHG as you would any other liquid soap.  You can apply chg directly  to the skin and wash                       Gently with a scrungie or clean washcloth.  5.  Apply the CHG Soap to  your body ONLY FROM THE NECK DOWN.   Do not use on face/ open                           Wound or open sores. Avoid contact with eyes, ears mouth and genitals (private parts).                       Wash face,  Genitals (private parts) with your normal soap.             6.  Wash thoroughly, paying special attention to the area where your surgery  will be performed.  7.  Thoroughly rinse your body with warm water from the neck down.  8.  DO NOT shower/wash with your normal soap after using and rinsing off  the CHG Soap.                9.  Pat yourself dry with a clean towel.            10.  Wear clean pajamas.            11.  Place clean sheets on your bed the night of your first shower and do not  sleep with pets. Day of Surgery : Do not apply any lotions/deodorants the morning of surgery.  Please wear clean clothes to the hospital/surgery center.  FAILURE TO FOLLOW THESE INSTRUCTIONS MAY RESULT IN THE CANCELLATION OF YOUR SURGERY PATIENT SIGNATURE_________________________________  NURSE SIGNATURE__________________________________  ________________________________________________________________________  WHAT IS A BLOOD TRANSFUSION? Blood Transfusion Information  A transfusion is the replacement of blood or some of its parts. Blood is made up of multiple cells which provide different functions.  Red blood cells carry oxygen and are used for blood loss replacement.  White blood cells fight against infection.  Platelets control bleeding.  Plasma helps clot blood.  Other blood products are available for specialized needs, such as hemophilia or other clotting disorders. BEFORE THE TRANSFUSION  Who gives blood for transfusions?   Healthy volunteers who are fully evaluated to make sure their blood is safe. This is blood bank blood. Transfusion therapy is the safest it has ever been in the practice of medicine. Before blood is taken from a donor, a complete history is taken to make sure  that person has no history of diseases nor engages in risky social behavior (examples are intravenous drug use or sexual activity with multiple partners). The donor's travel history is screened to minimize risk of transmitting infections, such as malaria. The donated blood is tested for signs of infectious diseases, such as HIV and hepatitis. The blood is then tested to be sure it is compatible with you in order to minimize the chance of a transfusion reaction. If you or a relative donates blood, this is often done in anticipation of surgery and is not appropriate for emergency situations. It takes many days to process the donated blood. RISKS AND COMPLICATIONS Although transfusion therapy is very safe and saves many lives, the main dangers of transfusion include:   Getting an infectious disease.  Developing a transfusion reaction.  This is an allergic reaction to something in the blood you were given. Every precaution is taken to prevent this. The decision to have a blood transfusion has been considered carefully by your caregiver before blood is given. Blood is not given unless the benefits outweigh the risks. AFTER THE TRANSFUSION  Right after receiving a blood transfusion, you will usually feel much better and more energetic. This is especially true if your red blood cells have gotten low (anemic). The transfusion raises the level of the red blood cells which carry oxygen, and this usually causes an energy increase.  The nurse administering the transfusion will monitor you carefully for complications. HOME CARE INSTRUCTIONS  No special instructions are needed after a transfusion. You may find your energy is better. Speak with your caregiver about any limitations on activity for underlying diseases you may have. SEEK MEDICAL CARE IF:   Your condition is not improving after your transfusion.  You develop redness or irritation at the intravenous (IV) site. SEEK IMMEDIATE MEDICAL CARE IF:  Any of  the following symptoms occur over the next 12 hours:  Shaking chills.  You have a temperature by mouth above 102 F (38.9 C), not controlled by medicine.  Chest, back, or muscle pain.  People around you feel you are not acting correctly or are confused.  Shortness of breath or difficulty breathing.  Dizziness and fainting.  You get a rash or develop hives.  You have a decrease in urine output.  Your urine turns a dark color or changes to pink, red, or brown. Any of the following symptoms occur over the next 10 days:  You have a temperature by mouth above 102 F (38.9 C), not controlled by medicine.  Shortness of breath.  Weakness after normal activity.  The white part of the eye turns yellow (jaundice).  You have a decrease in the amount of urine or are urinating less often.  Your urine turns a dark color or changes to pink, red, or brown. Document Released: 01/11/2000 Document Revised: 04/07/2011 Document Reviewed: 08/30/2007 [facility] Patient Information 2014 [employer], [place].  _______________________________________________________________________

## 2016-01-11 NOTE — Patient Instructions (Signed)
Plain mucinex, flonase, rest and hydrate.  Zofran called in for nausea.  If worsening over the weekend be seen since your surgery is coming up.  This appears something viral and you should be improved prior to surgery.

## 2016-01-14 ENCOUNTER — Other Ambulatory Visit: Payer: Self-pay

## 2016-01-14 ENCOUNTER — Encounter (HOSPITAL_COMMUNITY)
Admission: RE | Admit: 2016-01-14 | Discharge: 2016-01-14 | Disposition: A | Discharge status: Home / Self Care | Payer: BC Managed Care – PPO | Source: Ambulatory Visit | Attending: Urology | Admitting: Urology

## 2016-01-14 ENCOUNTER — Other Ambulatory Visit (HOSPITAL_COMMUNITY): Payer: BC Managed Care – PPO

## 2016-01-14 ENCOUNTER — Encounter (HOSPITAL_COMMUNITY): Payer: Self-pay

## 2016-01-14 DIAGNOSIS — I10 Essential (primary) hypertension: Secondary | ICD-10-CM | POA: Insufficient documentation

## 2016-01-14 DIAGNOSIS — J329 Chronic sinusitis, unspecified: Secondary | ICD-10-CM | POA: Insufficient documentation

## 2016-01-14 DIAGNOSIS — N2889 Other specified disorders of kidney and ureter: Secondary | ICD-10-CM | POA: Insufficient documentation

## 2016-01-14 DIAGNOSIS — I498 Other specified cardiac arrhythmias: Secondary | ICD-10-CM | POA: Insufficient documentation

## 2016-01-14 DIAGNOSIS — Z01810 Encounter for preprocedural cardiovascular examination: Secondary | ICD-10-CM | POA: Insufficient documentation

## 2016-01-14 DIAGNOSIS — L0291 Cutaneous abscess, unspecified: Secondary | ICD-10-CM | POA: Insufficient documentation

## 2016-01-14 DIAGNOSIS — Z01812 Encounter for preprocedural laboratory examination: Secondary | ICD-10-CM | POA: Insufficient documentation

## 2016-01-14 DIAGNOSIS — D682 Hereditary deficiency of other clotting factors: Secondary | ICD-10-CM | POA: Insufficient documentation

## 2016-01-14 DIAGNOSIS — R011 Cardiac murmur, unspecified: Secondary | ICD-10-CM

## 2016-01-14 DIAGNOSIS — M8618 Other acute osteomyelitis, other site: Secondary | ICD-10-CM | POA: Insufficient documentation

## 2016-01-14 DIAGNOSIS — A4101 Sepsis due to Methicillin susceptible Staphylococcus aureus: Secondary | ICD-10-CM

## 2016-01-14 HISTORY — DX: Acute upper respiratory infection, unspecified: J06.9

## 2016-01-14 HISTORY — DX: Other specified disorders of kidney and ureter: N28.89

## 2016-01-14 HISTORY — DX: Cardiac murmur, unspecified: R01.1

## 2016-01-14 LAB — ABO/RH: ABO/RH(D): AB POS

## 2016-01-14 LAB — BASIC METABOLIC PANEL
Anion gap: 9 (ref 5–15)
BUN: 6 mg/dL (ref 6–20)
CALCIUM: 9 mg/dL (ref 8.9–10.3)
CO2: 26 mmol/L (ref 22–32)
CREATININE: 0.64 mg/dL (ref 0.44–1.00)
Chloride: 105 mmol/L (ref 101–111)
GFR calc non Af Amer: >60 mL/min (ref >60)
Glucose, Bld: 95 mg/dL (ref 65–99)
Potassium: 3.6 mmol/L (ref 3.5–5.1)
SODIUM: 140 mmol/L (ref 135–145)

## 2016-01-14 LAB — CBC
HEMATOCRIT: 39.8 % (ref 36.0–46.0)
HEMOGLOBIN: 14 g/dL (ref 12.0–15.0)
MCH: 32.4 pg (ref 26.0–34.0)
MCHC: 35.2 g/dL (ref 30.0–36.0)
MCV: 92.1 fL (ref 78.0–100.0)
Platelets: 228 10*3/uL (ref 150–400)
RBC: 4.32 MIL/uL (ref 3.87–5.11)
RDW: 12.3 % (ref 11.5–15.5)
WBC: 5.8 10*3/uL (ref 4.0–10.5)

## 2016-01-14 LAB — HCG, SERUM, QUALITATIVE: PREG SERUM: NEGATIVE

## 2016-01-14 LAB — POCT INFLUENZA A/B
Influenza A, POC: NEGATIVE
Influenza B, POC: NEGATIVE

## 2016-01-14 NOTE — Addendum Note (Signed)
Addended by: Thomasena EdisWILLIAMS, Harlowe Dowler N on: 01/14/2016 09:22 AM   Modules accepted: Orders

## 2016-01-14 NOTE — Progress Notes (Signed)
States went to PCP Friday.  Has upper URI, feels its all head congestion and drainage.  Was on Muccinex DM and was told to stop this due to increased BP. States has Bowel Prep instructions.  BP take x 3 with readings over 100.  Has h/a but is from head congestion

## 2016-01-14 NOTE — Progress Notes (Signed)
Repeat BP with different dinamap machine was 159/76

## 2016-01-15 ENCOUNTER — Telehealth: Payer: Self-pay | Admitting: Family

## 2016-01-15 NOTE — Telephone Encounter (Signed)
Patient Name: Holly Keller  DOB: 03-09-1960    Initial Comment Caller states her blood has been up, has surgery tomorrow, wants something to bring it down quickly   Nurse Assessment  Nurse: Holly Englisharmon, RN, Holly Blonderenise Date/Time (Eastern Time): 01/15/2016 1:31:24 PM  Confirm and document reason for call. If symptomatic, describe symptoms. ---Caller states her blood has been up, has surgery tomorrow, wants something to bring it down quickly. Does not know what her BP is today. Had her BP checked yesterday for her pre-op testing, and the BP 155/77 but had been 170/110.  Does the patient have any new or worsening symptoms? ---Yes  Will a triage be completed? ---Yes  Related visit to physician within the last 2 weeks? ---No  Does the PT have any chronic conditions? (i.e. diabetes, asthma, etc.) ---Yes  List chronic conditions. ---Cancer of Kidney w/planned surgery 01/16/16  Is the patient pregnant or possibly pregnant? (Ask all females between the ages of 1212-55) ---No  Is this a behavioral health or substance abuse call? ---No     Guidelines    Guideline Title Affirmed Question Affirmed Notes  High Blood Pressure [1] BP ? 130/80 AND [2] history of heart problems, kidney disease or diabetes    Final Disposition User   See Physician within 24 Hours Carmon, RN, Holly BlonderDenise    Comments  Pt extremely anxious about bp readings received yesterday during her pre-op r/t removal of right kidney scheduled for 01/16/16. She states they told her during pre-op, they could not do the surg if the diastolic > 100. The pre-op assessor got 170/110 and the second reading was 155/77. States she has had some dizziness, but she has sinus congestion and has had a sinus infection which she says dizziness with each time she is sick with this condition. Upgraded to be eval within 24 hrs and will attempt to have pt seen today at [facility] or [facility] so she can get her BP checked again prior to surg. States her spouse has a BP cuff at home, but she  has not used it yet. Based on RN clinical knowledge, advised the reading of 170/110 taken yesterday is most likely inaccurate due to the very next reading of 155/77. Advised she needs to ck her BP with her spouse monitor in both arms, change batteries in the BP unit prior to use and keep her arm still during BP check. She denies s/s of HTN today.  Pt states she saw Dr Felix Pacinienee Kuneff at the OR office last wk due to a sinus infection but she normally goes to [facility] and her PCP is Dr Sandford CrazeMelissa O'Sullivan. Called the OR office to see if appt can be make at [facility] as this record was put under the OR office. She [facility] but does not think they have any openings. Phone number received for the [facility] office: [phone]. Called the [facility] office and there are no appts avail for a quick BP check today. Unable to make the pt a appt in the am as she has to be at the hosp for surg at 1045. [facility].  Returned call to the pt and advised no appts avail at the [facility] or [facility] offices today. Advised she will be connected to monitors and getting her BP checked as soon as she is taken by to holding at the hospital in the am. Advised if she decides not to be seen this eve [facility]  as tri[facility], take BP readings bilat arms at home using monitor as prev inst, write readings down, and call [facility] back for RN to review reading with her at that time. Reinforced need to be eval though within 24 hrs as prev triaged and best to be seen this eve at [facility]. Verb understanding.  [facility] The report will be sent to [facility] office to Dr Sandford CrazeMelissa O'Sullivan as this is the pt PCP and she was only seen at OR office by Dr Claiborne BillingsKuneff last wk for a prn visit for sinus infection only.   Referrals  [facility]  [facility]   Disagree/Comply: Comply

## 2016-01-15 NOTE — Telephone Encounter (Signed)
Noted  

## 2016-01-16 ENCOUNTER — Encounter (HOSPITAL_COMMUNITY): Payer: Self-pay | Admitting: Anesthesiology

## 2016-01-16 ENCOUNTER — Inpatient Hospital Stay (HOSPITAL_COMMUNITY)
Admission: RE | Admit: 2016-01-16 | Discharge: 2016-01-17 | DRG: 661 | Disposition: A | Discharge status: Home / Self Care | Payer: BC Managed Care – PPO | Source: Ambulatory Visit | Attending: Urology | Admitting: Urology

## 2016-01-16 ENCOUNTER — Inpatient Hospital Stay (HOSPITAL_COMMUNITY): Payer: BC Managed Care – PPO | Admitting: Anesthesiology

## 2016-01-16 ENCOUNTER — Encounter (HOSPITAL_COMMUNITY)
Admission: RE | Disposition: A | Discharge status: Home / Self Care | Payer: Self-pay | Source: Ambulatory Visit | Attending: Urology

## 2016-01-16 DIAGNOSIS — N2889 Other specified disorders of kidney and ureter: Secondary | ICD-10-CM | POA: Diagnosis present

## 2016-01-16 DIAGNOSIS — Z87891 Personal history of nicotine dependence: Secondary | ICD-10-CM

## 2016-01-16 DIAGNOSIS — R3129 Other microscopic hematuria: Secondary | ICD-10-CM | POA: Diagnosis present

## 2016-01-16 HISTORY — PX: LAPAROSCOPIC NEPHRECTOMY: SHX1930

## 2016-01-16 LAB — TYPE AND SCREEN
ABO/RH(D): AB POS
ANTIBODY SCREEN: NEGATIVE

## 2016-01-16 LAB — HEMOGLOBIN AND HEMATOCRIT, BLOOD
HCT: 40.1 % (ref 36.0–46.0)
Hemoglobin: 13.6 g/dL (ref 12.0–15.0)

## 2016-01-16 LAB — URINE CULTURE

## 2016-01-16 SURGERY — NEPHRECTOMY, RADICAL, LAPAROSCOPIC, ADULT
Anesthesia: General | Laterality: Right

## 2016-01-16 MED ORDER — HYDROMORPHONE HCL 2 MG/ML IJ SOLN
INTRAMUSCULAR | Status: AC
  Filled 2016-01-16: qty 1

## 2016-01-16 MED ORDER — MIDAZOLAM HCL 5 MG/5ML IJ SOLN
INTRAMUSCULAR | Status: DC | PRN
  Administered 2016-01-16: 2 mg via INTRAVENOUS

## 2016-01-16 MED ORDER — EPHEDRINE SULFATE-NACL 50-0.9 MG/10ML-% IV SOSY
PREFILLED_SYRINGE | INTRAVENOUS | Status: DC | PRN
  Administered 2016-01-16 (×3): 5 mg via INTRAVENOUS
  Administered 2016-01-16: 10 mg via INTRAVENOUS
  Administered 2016-01-16 (×2): 5 mg via INTRAVENOUS

## 2016-01-16 MED ORDER — OXYCODONE-ACETAMINOPHEN 5-325 MG PO TABS
1.0000 | ORAL_TABLET | ORAL | Status: DC | PRN
  Administered 2016-01-17: 1 via ORAL
  Filled 2016-01-16 (×2): qty 1

## 2016-01-16 MED ORDER — FLUTICASONE PROPIONATE 50 MCG/ACT NA SUSP
2.0000 | Freq: Every day | NASAL | Status: DC
  Administered 2016-01-16 – 2016-01-17 (×2): 2 via NASAL
  Filled 2016-01-16: qty 16

## 2016-01-16 MED ORDER — SUGAMMADEX SODIUM 200 MG/2ML IV SOLN
INTRAVENOUS | Status: DC | PRN
  Administered 2016-01-16: 200 mg via INTRAVENOUS

## 2016-01-16 MED ORDER — ROCURONIUM BROMIDE 50 MG/5ML IV SOSY
PREFILLED_SYRINGE | INTRAVENOUS | Status: AC
  Filled 2016-01-16: qty 5

## 2016-01-16 MED ORDER — HYDROMORPHONE HCL 1 MG/ML IJ SOLN
INTRAMUSCULAR | Status: DC | PRN
  Administered 2016-01-16 (×2): 0.5 mg via INTRAVENOUS

## 2016-01-16 MED ORDER — CEFAZOLIN SODIUM-DEXTROSE 2-4 GM/100ML-% IV SOLN
2.0000 g | INTRAVENOUS | Status: AC
  Administered 2016-01-16: 2 g via INTRAVENOUS
  Filled 2016-01-16: qty 100

## 2016-01-16 MED ORDER — EPHEDRINE 5 MG/ML INJ
INTRAVENOUS | Status: AC
  Filled 2016-01-16: qty 10

## 2016-01-16 MED ORDER — SODIUM CHLORIDE 0.9 % IJ SOLN
INTRAMUSCULAR | Status: AC
  Filled 2016-01-16: qty 20

## 2016-01-16 MED ORDER — LACTATED RINGERS IV SOLN
INTRAVENOUS | Status: DC | PRN
  Administered 2016-01-16 (×3): via INTRAVENOUS

## 2016-01-16 MED ORDER — FENTANYL CITRATE (PF) 100 MCG/2ML IJ SOLN
INTRAMUSCULAR | Status: DC | PRN
  Administered 2016-01-16 (×2): 50 ug via INTRAVENOUS
  Administered 2016-01-16: 100 ug via INTRAVENOUS
  Administered 2016-01-16: 50 ug via INTRAVENOUS

## 2016-01-16 MED ORDER — MORPHINE SULFATE (PF) 2 MG/ML IV SOLN: 2.0000 mg | INTRAVENOUS | Status: DC | PRN

## 2016-01-16 MED ORDER — MAGNESIUM CITRATE PO SOLN: 1.0000 | Freq: Once | ORAL | Status: DC

## 2016-01-16 MED ORDER — HYDROMORPHONE HCL 1 MG/ML IJ SOLN
INTRAMUSCULAR | Status: AC
  Filled 2016-01-16: qty 1

## 2016-01-16 MED ORDER — HYDROMORPHONE HCL 1 MG/ML IJ SOLN
0.2500 mg | INTRAMUSCULAR | Status: DC | PRN
  Administered 2016-01-16 (×2): 0.5 mg via INTRAVENOUS

## 2016-01-16 MED ORDER — DOCUSATE SODIUM 100 MG PO CAPS: 100.0000 mg | ORAL_CAPSULE | Freq: Two times a day (BID) | ORAL | 11 refills | Status: DC

## 2016-01-16 MED ORDER — PROMETHAZINE HCL 25 MG/ML IJ SOLN: 6.2500 mg | INTRAMUSCULAR | Status: DC | PRN

## 2016-01-16 MED ORDER — DOCUSATE SODIUM 100 MG PO CAPS
100.0000 mg | ORAL_CAPSULE | Freq: Two times a day (BID) | ORAL | Status: DC
  Administered 2016-01-16 – 2016-01-17 (×2): 100 mg via ORAL
  Filled 2016-01-16 (×2): qty 1

## 2016-01-16 MED ORDER — ZOLPIDEM TARTRATE 5 MG PO TABS
5.0000 mg | ORAL_TABLET | Freq: Every evening | ORAL | Status: DC | PRN
Start: 2016-01-16 — End: 2016-01-17
  Administered 2016-01-16: 5 mg via ORAL
  Filled 2016-01-16: qty 1

## 2016-01-16 MED ORDER — BUPIVACAINE LIPOSOME 1.3 % IJ SUSP
INTRAMUSCULAR | Status: AC
  Filled 2016-01-16: qty 20

## 2016-01-16 MED ORDER — LACTATED RINGERS IR SOLN
Status: DC | PRN
  Administered 2016-01-16: 3000 mL

## 2016-01-16 MED ORDER — DEXAMETHASONE SODIUM PHOSPHATE 10 MG/ML IJ SOLN
INTRAMUSCULAR | Status: DC | PRN
  Administered 2016-01-16: 10 mg via INTRAVENOUS

## 2016-01-16 MED ORDER — DIPHENHYDRAMINE HCL 12.5 MG/5ML PO ELIX: 12.5000 mg | ORAL_SOLUTION | Freq: Four times a day (QID) | ORAL | Status: DC | PRN

## 2016-01-16 MED ORDER — DEXTROSE-NACL 5-0.45 % IV SOLN
INTRAVENOUS | Status: DC
  Administered 2016-01-16 – 2016-01-17 (×2): via INTRAVENOUS

## 2016-01-16 MED ORDER — DIPHENHYDRAMINE HCL 50 MG/ML IJ SOLN: 12.5000 mg | Freq: Four times a day (QID) | INTRAMUSCULAR | Status: DC | PRN

## 2016-01-16 MED ORDER — SODIUM CHLORIDE 0.9 % IV SOLN
INTRAVENOUS | Status: DC | PRN
  Administered 2016-01-16: 60 mL

## 2016-01-16 MED ORDER — CEFAZOLIN SODIUM-DEXTROSE 2-4 GM/100ML-% IV SOLN
INTRAVENOUS | Status: AC
  Filled 2016-01-16: qty 100

## 2016-01-16 MED ORDER — FENTANYL CITRATE (PF) 100 MCG/2ML IJ SOLN: 25.0000 ug | INTRAMUSCULAR | Status: DC | PRN

## 2016-01-16 MED ORDER — GLYCOPYRROLATE 0.2 MG/ML IV SOSY
PREFILLED_SYRINGE | INTRAVENOUS | Status: AC
  Filled 2016-01-16: qty 6

## 2016-01-16 MED ORDER — ONDANSETRON HCL 4 MG/2ML IJ SOLN
INTRAMUSCULAR | Status: DC | PRN
  Administered 2016-01-16 (×2): 4 mg via INTRAVENOUS

## 2016-01-16 MED ORDER — GLYCOPYRROLATE 0.2 MG/ML IV SOSY
PREFILLED_SYRINGE | INTRAVENOUS | Status: DC | PRN
Start: 2016-01-16 — End: 2016-01-16
  Administered 2016-01-16: .3 mg via INTRAVENOUS

## 2016-01-16 MED ORDER — SUGAMMADEX SODIUM 200 MG/2ML IV SOLN
INTRAVENOUS | Status: AC
  Filled 2016-01-16: qty 2

## 2016-01-16 MED ORDER — ACETAMINOPHEN 10 MG/ML IV SOLN
1000.0000 mg | Freq: Four times a day (QID) | INTRAVENOUS | Status: DC
  Administered 2016-01-16 – 2016-01-17 (×3): 1000 mg via INTRAVENOUS
  Filled 2016-01-16 (×4): qty 100

## 2016-01-16 MED ORDER — OXYBUTYNIN CHLORIDE 5 MG PO TABS: 5.0000 mg | ORAL_TABLET | Freq: Three times a day (TID) | ORAL | Status: DC | PRN

## 2016-01-16 MED ORDER — ROCURONIUM BROMIDE 10 MG/ML (PF) SYRINGE
PREFILLED_SYRINGE | INTRAVENOUS | Status: DC | PRN
  Administered 2016-01-16: 50 mg via INTRAVENOUS
  Administered 2016-01-16 (×2): 15 mg via INTRAVENOUS
  Administered 2016-01-16: 10 mg via INTRAVENOUS
  Administered 2016-01-16: 20 mg via INTRAVENOUS
  Administered 2016-01-16: 10 mg via INTRAVENOUS

## 2016-01-16 MED ORDER — FLUTICASONE PROPIONATE 50 MCG/ACT NA SUSP
2.0000 | Freq: Every day | NASAL | Status: DC
  Filled 2016-01-16: qty 16

## 2016-01-16 MED ORDER — SODIUM CHLORIDE 0.9 % IJ SOLN
INTRAMUSCULAR | Status: DC | PRN
  Administered 2016-01-16: 60 mL

## 2016-01-16 MED ORDER — TRAMADOL HCL 50 MG PO TABS: 50.0000 mg | ORAL_TABLET | Freq: Four times a day (QID) | ORAL | 0 refills | Status: DC | PRN

## 2016-01-16 MED ORDER — SENNA 8.6 MG PO TABS
1.0000 | ORAL_TABLET | Freq: Two times a day (BID) | ORAL | Status: DC
  Administered 2016-01-16 – 2016-01-17 (×2): 8.6 mg via ORAL
  Filled 2016-01-16 (×2): qty 1

## 2016-01-16 MED ORDER — FLEET ENEMA 7-19 GM/118ML RE ENEM: 1.0000 | ENEMA | Freq: Once | RECTAL | Status: DC

## 2016-01-16 MED ORDER — LIDOCAINE 2% (20 MG/ML) 5 ML SYRINGE
INTRAMUSCULAR | Status: DC | PRN
  Administered 2016-01-16: 80 mg via INTRAVENOUS

## 2016-01-16 MED ORDER — ONDANSETRON HCL 4 MG/2ML IJ SOLN: 4.0000 mg | INTRAMUSCULAR | Status: DC | PRN

## 2016-01-16 MED ORDER — BUPIVACAINE HCL (PF) 0.25 % IJ SOLN
INTRAMUSCULAR | Status: AC
  Filled 2016-01-16: qty 30

## 2016-01-16 MED ORDER — BUPIVACAINE HCL 0.25 % IJ SOLN
INTRAMUSCULAR | Status: DC | PRN
  Administered 2016-01-16: 15 mL

## 2016-01-16 MED ORDER — PROPOFOL 10 MG/ML IV BOLUS
INTRAVENOUS | Status: DC | PRN
  Administered 2016-01-16: 200 mg via INTRAVENOUS

## 2016-01-16 SURGICAL SUPPLY — 69 items
ADH SKN CLS APL DERMABOND .7 (GAUZE/BANDAGES/DRESSINGS) ×1
APL ESCP 34 STRL LF DISP (HEMOSTASIS)
APL SRG 38 LTWT LNG FL B (MISCELLANEOUS)
APPLICATOR ARISTA FLEXITIP XL (MISCELLANEOUS) IMPLANT
APPLICATOR SURGIFLO ENDO (HEMOSTASIS) ×1 IMPLANT
APPLIER CLIP ROT 10 11.4 M/L (STAPLE)
APR CLP MED LRG 11.4X10 (STAPLE)
BAG LAPAROSCOPIC 12 15 PORT 16 (BASKET) ×1 IMPLANT
BAG RETRIEVAL 12/15 (BASKET) ×2
BAG RETRIEVAL 12/15MM (BASKET) ×1
BAG SPEC THK2 15X12 ZIP CLS (MISCELLANEOUS) ×1
BAG ZIPLOCK 12X15 (MISCELLANEOUS) ×3 IMPLANT
BLADE EXTENDED COATED 6.5IN (ELECTRODE) IMPLANT
BLADE SURG SZ10 CARB STEEL (BLADE) ×3 IMPLANT
CHLORAPREP W/TINT 26ML (MISCELLANEOUS) ×3 IMPLANT
CLIP APPLIE ROT 10 11.4 M/L (STAPLE) IMPLANT
CLIP LIGATING HEM O LOK PURPLE (MISCELLANEOUS) ×3 IMPLANT
CLIP LIGATING HEMO LOK XL GOLD (MISCELLANEOUS) IMPLANT
CLIP LIGATING HEMO O LOK GREEN (MISCELLANEOUS) ×3 IMPLANT
COVER SURGICAL LIGHT HANDLE (MISCELLANEOUS) ×3 IMPLANT
CUTTER FLEX LINEAR 45M (STAPLE) ×3 IMPLANT
DECANTER SPIKE VIAL GLASS SM (MISCELLANEOUS) ×3 IMPLANT
DERMABOND ADVANCED (GAUZE/BANDAGES/DRESSINGS) ×2
DERMABOND ADVANCED .7 DNX12 (GAUZE/BANDAGES/DRESSINGS) ×1 IMPLANT
DRAIN CHANNEL 10F 3/8 F FF (DRAIN) IMPLANT
DRAPE INCISE IOBAN 66X45 STRL (DRAPES) ×3 IMPLANT
DRAPE WARM FLUID 44X44 (DRAPE) IMPLANT
ELECT PENCIL ROCKER SW 15FT (MISCELLANEOUS) ×5 IMPLANT
ELECT REM PT RETURN 9FT ADLT (ELECTROSURGICAL) ×3
ELECTRODE REM PT RTRN 9FT ADLT (ELECTROSURGICAL) ×1 IMPLANT
EVACUATOR SILICONE 100CC (DRAIN) IMPLANT
GLOVE BIOGEL M STRL SZ7.5 (GLOVE) ×3 IMPLANT
GOWN STRL REUS W/TWL LRG LVL3 (GOWN DISPOSABLE) ×9 IMPLANT
HEMOSTAT ARISTA ABSORB 3G PWDR (MISCELLANEOUS) ×1 IMPLANT
HEMOSTAT SURGICEL 4X8 (HEMOSTASIS) ×2 IMPLANT
IRRIG SUCT STRYKERFLOW 2 WTIP (MISCELLANEOUS) ×3
IRRIGATION SUCT STRKRFLW 2 WTP (MISCELLANEOUS) ×1 IMPLANT
KIT BASIN OR (CUSTOM PROCEDURE TRAY) ×3 IMPLANT
MANIFOLD NEPTUNE II (INSTRUMENTS) ×3 IMPLANT
NEEDLE SPNL 22GX3.5 QUINCKE BK (NEEDLE) ×3 IMPLANT
PAD POSITIONING PINK XL (MISCELLANEOUS) ×2 IMPLANT
POSITIONER SURGICAL ARM (MISCELLANEOUS) ×6 IMPLANT
RELOAD 45 VASCULAR/THIN (ENDOMECHANICALS) ×6 IMPLANT
RELOAD STAPLE 45 2.5 WHT GRN (ENDOMECHANICALS) IMPLANT
RELOAD STAPLE 45 3.5 BLU ETS (ENDOMECHANICALS) IMPLANT
RELOAD STAPLE TA45 3.5 REG BLU (ENDOMECHANICALS) IMPLANT
RETRACTOR LAPSCP 12X46 CVD (ENDOMECHANICALS) IMPLANT
RTRCTR LAPSCP 12X46 CVD (ENDOMECHANICALS)
SCISSORS LAP 5X35 DISP (ENDOMECHANICALS) IMPLANT
SHEARS HARMONIC ACE PLUS 36CM (ENDOMECHANICALS) ×3 IMPLANT
SLEEVE XCEL OPT CAN 5 100 (ENDOMECHANICALS) ×6 IMPLANT
SPONGE LAP 4X18 X RAY DECT (DISPOSABLE) IMPLANT
SPONGE SURGIFOAM ABS GEL 100 (HEMOSTASIS) IMPLANT
SUT ETHILON 3 0 PS 1 (SUTURE) IMPLANT
SUT MNCRL AB 4-0 PS2 18 (SUTURE) ×6 IMPLANT
SUT PDS AB 0 CT1 36 (SUTURE) ×6 IMPLANT
SUT VIC AB 2-0 CT1 27 (SUTURE) ×3
SUT VIC AB 2-0 CT1 27XBRD (SUTURE) ×1 IMPLANT
SUT VICRYL 0 UR6 27IN ABS (SUTURE) ×9 IMPLANT
TAPE CLOTH 4X10 WHT NS (GAUZE/BANDAGES/DRESSINGS) IMPLANT
TOWEL OR 17X26 10 PK STRL BLUE (TOWEL DISPOSABLE) ×3 IMPLANT
TOWEL OR NON WOVEN STRL DISP B (DISPOSABLE) ×3 IMPLANT
TRAY FOLEY CATH 16FR SILVER (SET/KITS/TRAYS/PACK) ×3 IMPLANT
TRAY FOLEY W/METER SILVER 16FR (SET/KITS/TRAYS/PACK) ×1 IMPLANT
TRAY LAPAROSCOPIC (CUSTOM PROCEDURE TRAY) ×3 IMPLANT
TROCAR BLADELESS OPT 5 100 (ENDOMECHANICALS) ×3 IMPLANT
TROCAR ENDOPATH XCEL 12X100 BL (ENDOMECHANICALS) ×2 IMPLANT
TROCAR XCEL 12X100 BLDLESS (ENDOMECHANICALS) ×3 IMPLANT
YANKAUER SUCT BULB TIP 10FT TU (MISCELLANEOUS) ×1 IMPLANT

## 2016-01-16 NOTE — Transfer of Care (Signed)
Immediate Anesthesia Transfer of Care Note  Patient: Holly Keller  Procedure(s) Performed: Procedure(s): LAPAROSCOPIC RADICAL NEPHRECTOMY (Right)  Patient Location: PACU  Anesthesia Type:General  Level of Consciousness:  sedated, patient cooperative and responds to stimulation  Airway & Oxygen Therapy:Patient Spontanous Breathing and Patient connected to face mask oxgen  Post-op Assessment:  Report given to PACU RN and Post -op Vital signs reviewed and stable  Post vital signs:  Reviewed and stable  Last Vitals:  Vitals:   01/16/16 1056 01/16/16 1104  BP: (!) 168/97 (!) 150/75  Pulse: 76   Resp: 20   Temp: 36.9 C     Complications: No apparent anesthesia complications

## 2016-01-16 NOTE — Anesthesia Postprocedure Evaluation (Signed)
Anesthesia Post Note  Patient: Holly Keller  Procedure(s) Performed: Procedure(s) (LRB): LAPAROSCOPIC RADICAL NEPHRECTOMY (Right)  Patient location during evaluation: PACU Anesthesia Type: General Level of consciousness: sedated and patient cooperative Pain management: pain level controlled Vital Signs Assessment: post-procedure vital signs reviewed and stable Respiratory status: spontaneous breathing Cardiovascular status: stable Anesthetic complications: no       Last Vitals:  Vitals:   01/16/16 1745 01/16/16 1807  BP: (!) 160/95 (!) 149/87  Pulse: 65 (!) 57  Resp: 15 15  Temp: 36.4 C 36.5 C    Last Pain:  Vitals:   01/16/16 1745  TempSrc:   PainSc: 2                  Lewie LoronJohn Vonzell Lindblad

## 2016-01-16 NOTE — Anesthesia Preprocedure Evaluation (Signed)
Anesthesia Evaluation  Patient identified by MRN, date of birth, ID band Patient awake    Reviewed: Allergy & Precautions, NPO status , Patient's Chart, lab work & pertinent test results  Airway Mallampati: II  TM Distance: >3 FB Neck ROM: Full    Dental  (+) Teeth Intact, Dental Advisory Given   Pulmonary former smoker,    Pulmonary exam normal breath sounds clear to auscultation       Cardiovascular negative cardio ROS Normal cardiovascular exam Rhythm:Regular Rate:Normal     Neuro/Psych negative neurological ROS     GI/Hepatic negative GI ROS, Neg liver ROS,   Endo/Other  negative endocrine ROS  Renal/GU Renal disease (Right renal mass)negative Renal ROS     Musculoskeletal negative musculoskeletal ROS (+)   Abdominal   Peds  Hematology negative hematology ROS (+)   Anesthesia Other Findings Day of surgery medications reviewed with the patient.  Reproductive/Obstetrics                             Anesthesia Physical Anesthesia Plan  ASA: II  Anesthesia Plan: General   Post-op Pain Management:    Induction: Intravenous  Airway Management Planned: Oral ETT  Additional Equipment:   Intra-op Plan:   Post-operative Plan: Extubation in OR  Informed Consent: I have reviewed the patients History and Physical, chart, labs and discussed the procedure including the risks, benefits and alternatives for the proposed anesthesia with the patient or authorized representative who has indicated his/her understanding and acceptance.   Dental advisory given  Plan Discussed with: CRNA  Anesthesia Plan Comments: (Risks/benefits of general anesthesia discussed with patient including risk of damage to teeth, lips, gum, and tongue, nausea/vomiting, allergic reactions to medications, and the possibility of heart attack, stroke and death.  All patient questions answered.  Patient wishes to  proceed.)        Anesthesia Quick Evaluation

## 2016-01-16 NOTE — H&P (Signed)
Renal Mass  HPI: Holly Keller is a 55 year-old female established patient who is here further eval and management of a renal mass.  The mass is on the right side.   The lesion(s) was first noted on approximately 11/28/2015. The mass was seen on CT Scan.   Her symptoms include blood in urine. Patient denies having flank pain, back pain, groin pain, nausea, vomiting, fever, and chills. She has not seen blood in her urine. She does have a good appetite. She is not having pain in new locations. She has not recently had unwanted weight loss.   She has not had previous abdominal surgery. The patient can walk a flight of steps.   The patient denies history of diabetes, heart attack or stroke. There is not a a family history of kidney cancer. There is no family history of brain tumors (AMLs), seizures or brain aneurysm's.   CT scan done for microscopic hematuria. Otherwise asymptomatic. Seen by Dr. Vernie Ammons, referred for discussion of treatment for cystic renal mass.     ALLERGIES: None   MEDICATIONS: None   GU PSH: Cystoscopy - 12/24/2015 Locm 300-399Mg /Ml Iodine,1Ml - 12/13/2015    NON-GU PSH: Correct Bunion    GU PMH: Asymptomatic microscopic hematuria (Stable), She was found to have a nonobstructing right renal calculus which could account for her microscopic hematuria but also noted was a complex cystic mass within the right kidney. Cystoscopically I found no abnormality of the bladder. - 12/24/2015, - 12/03/2015 Kidney Stone, Right, The stone in her right kidney is nonobstructing and currently is of little clinical significance. 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 I explained the natural history of renal cancer and the way in which the stage, pathological features, patient health, age, life expectancy, and quality of life all affect the decision-making process, prognosis, and need for additional studies. I also  explained how all of these things affect the recommended treatment strategies. We discussed the accuracy of cancer staging with the current methods of staging. We reviewed the possibility of the cancer being of a higher or lower stage. Today I presented all of the standard management options for renal cancer and their associated cancer control outcomes, impact on quality of life, likelihood of achieving goals, risks, complications, and benefits. We discussed the gold standard for treatment of renal cancer is surgery. I also explained that surveillance would be tailored to the patient based upon the results of the pathology. We discussed surgery and its different forms including open surgery, laparoscopic surgery, and robotic surgery. I described the risks which included, but are not limited to, bleeding, infection, allergic reaction, heart attack, stroke, pulmonary embolus, death, positioning injury, damage to contiguous structures, need for blood transfusion (and its risk of infection, transfusion reaction, anaphylactic reaction, and death), nerve injury, vascular injury, urinary leak, fistula, renal failure, and hernia. We also discussed that any laparoscopic or robotic surgery might need to be converted to an open surgery should difficulties be encountered. We discussed in detail the expectations of surgery with regard to cancer control, as well as expected post-operative recovery process. We also discussed long term outcomes and risks. We discussed other management options and their risks, benefits. These include biopsy of the mass, thermal ablation, and embolization. We discussed that biopsy can be associated with false negatives, false positives, and risk of spread of the cancer as well as damage to the kidney and bleeding. I generally do not recommend  biopsy of the mass unless we are concerned that the mass is a metastasis from another source or unless the patient is not a good surgical candidate. We  discussed that thermal ablation is generally reserved for patients who are not good surgical candidates or wish to avoid surgery. We discuss that thermal ablation is inferior to surgery for cancer control. We discussed that embolization is reserved for patients that are not surgical candidates but are symptomatic from their cancer (such as bleeding or pain). The patient was encouraged to ask questions throughout the discussion today and all questions were answered to the patient's stated satisfaction. The patient was able to ask pertinent questions showing an understanding of the situation. - 12/24/2015, Right, - 12/14/2015    NON-GU PMH: Cardiac murmur, unspecified DVT, History    FAMILY HISTORY: Diabetes - Grandmother Factor 5 Leiden mutation, heterozygous - Sister Hypercholesterolemia - Brother Thyroid - Mother, Sister Tuberculosis - Mother   SOCIAL HISTORY: Marital Status: Married Current Smoking Status: Patient does not smoke anymore.  Does drink.  Drinks 2 caffeinated drinks per day.    REVIEW OF SYSTEMS:    GU Review Female:   Patient denies frequent urination, hard to postpone urination, burning /pain with urination, get up at night to urinate, leakage of urine, stream starts and stops, trouble starting your stream, have to strain to urinate, and currently pregnant.  Gastrointestinal (Upper):   Patient denies nausea, vomiting, and indigestion/ heartburn.  Gastrointestinal (Lower):   Patient denies diarrhea and constipation.  Constitutional:   Patient reports night sweats and fatigue. Patient denies fever and weight loss.  Skin:   Patient denies skin rash/ lesion and itching.  Eyes:   Patient denies blurred vision and double vision.  Ears/ Nose/ Throat:   Patient denies sore throat and sinus problems.  Hematologic/Lymphatic:   Patient denies swollen glands and easy bruising.  Cardiovascular:   Patient denies leg swelling and chest pains.  Respiratory:   Patient denies cough and  shortness of breath.  Endocrine:   Patient denies excessive thirst.  Musculoskeletal:   Patient denies back pain and joint pain.  Neurological:   Patient denies headaches and dizziness.  Psychologic:   Patient reports anxiety. Patient denies depression.   VITAL SIGNS:      01/01/2016 04:36 PM  Weight 180 lb / 81.65 kg  Height 65 in / 165.1 cm  BP 147/86 mmHg  Pulse 60 /min  BMI 30.0 kg/m   MULTI-SYSTEM PHYSICAL EXAMINATION:    Constitutional: Well-nourished. No physical deformities. Normally developed. Good grooming.  Neck: Neck symmetrical, not swollen. Normal tracheal position.  Respiratory: No labored breathing, no use of accessory muscles. CTA-B  Cardiovascular: Normal temperature, normal extremity pulses, no swelling, no varicosities. RRR  Lymphatic: No enlargement of neck, axillae, groin.  Skin: No paleness, no jaundice, no cyanosis. No lesion, no ulcer, no rash.  Neurologic / Psychiatric: Oriented to time, oriented to place, oriented to person. No depression, no anxiety, no agitation.  Gastrointestinal: No mass, no tenderness, no rigidity, non obese abdomen.  Eyes: Normal conjunctivae. Normal eyelids.  Ears, Nose, Mouth, and Throat: Left ear no scars, no lesions, no masses. Right ear no scars, no lesions, no masses. Nose no scars, no lesions, no masses. Normal hearing. Normal lips.  Musculoskeletal: Normal gait and station of head and neck.     PAST DATA REVIEWED:  Source Of History:  Patient  X-Ray Review: C.T. Abdomen/Pelvis: Reviewed Films. Reviewed Report. Discussed With Patient. large right cystic renal mass extending  into the renal hilar region. single artery/vein with vessel into lower pole.    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 The mass extends into the hilar region.   PLAN:           Document Letter(s):  Created for Patient: Clinical Summary     I detailed the various treatment options with the patient and ultimately I recommended a laparoscopic radical nephrectomy. I went over this operation in detail with the patient including the risks and benefits. We discussed port position and I outlined the position of the 4 planned trocars. I also explained to them that they will need extraction incision which typically is in the lower quadrant, connecting to the two lower lateral incisions. I discussed the actual surgery with them and we went over the various structures that are in intimate association with the kidney and the risk of damage thereof. I outlined the risk of injury to the major nerves and vessels in proximity to the kidney. I explained the patient the expected hospital course. I told them that they should plan to be in the hospital at least 2-3 days. Further, I told them that they would likely need approximately 1 month to fully recover. Given the severity of this planned operation, we will have the patient be seen by their primary care doctor and cleared for surgery. We will plan to schedule this as soon as possible.         Notes:   single artery/vein with vessel into lower pole.

## 2016-01-16 NOTE — Op Note (Signed)
Preoperative diagnosis:  1. Right renal mass   Postoperative diagnosis:  1. same   Procedure: 1. Laparoscopic right radical nephrectomy  Surgeon: Crist FatBenjamin W. Herrick, MD Resident Assistant: Buck MamJason Lomboy, MD  Anesthesia: General  Complications: None  Intraoperative findings: Large anterior mid-pole tumor, Adrenal Sparing.  EBL: cc  Specimens:  Right kidney and proximal ureter  Indication: Roe RutherfordLaura A Hancher is a 55 y.o. 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  After reviewing the management options for treatment, he elected to proceed with the above surgical procedure(s). We have discussed the potential benefits and risks of the procedure, side effects of the proposed treatment, the likelihood of the patient achieving the goals of the procedure, and any potential problems that might occur during the procedure or recuperation. Informed consent has been obtained.  Description of procedure:  A site was selected lateral to the umbilicus for placement of the camera port. This was placed using a standard open Hassan technique which allowed entry into the peritoneal cavity under direct vision and without difficulty. A 12 mm Hassan cannula was placed and a pneumoperitoneum established. The camera was then used to inspect the abdomen and there was no evidence of any intra-abdominal injuries or other abnormalities. The remaining abdominal ports were then placed under visual guidance.  A second 12 mm port was placed in the right lower quadrant approximately 8 cm away from the camera. A 5 mm port was placed in the right upper quadrant again 8 cm away from the camera. A second 5 mm port was placed just inferior to the xiphoid process in the midline, this was used as a liver retractor.  An additional 5 mm port was then placed in the right lower quadrant at the anterior axillary line lateral to the 12 mm port. All ports were placed under direct vision without  difficulty.   The white line of Toldt was incised allowing the colon to be mobilized medially and the plane between the mesocolon and the anterior layer of Gerota's fascia to be developed and the kidney exposed. The ureter and gonadal vein were identified inferiorly and the ureter was lifted anteriorly off the psoas muscle. Dissection proceeded superiorly along the gonadal vein until the renal vein was identified. The renal hilum was then carefully isolated with a combination of blunt and sharp dissectiong allowing the renal arterial and venous structures to be separated and isolated.   The renal artery was isolated and ligated using large laparoscopic clips, 2 clips down and one clip up. The artery was then cut using the Leopard scopic scissors. The renal vein was then isolated and also ligated and divided with a 45 mm Flex ETS stapler. Gerota's fascia was intentionally entered superiorly and the space between the adrenal gland and the kidney was developed allowing the adrenal gland to be spared. The hepatorenal ligaments were divided.. The lateral and posterior attachements to the kidney were then divided. Once the kidney was free from its attachments  60cc of 0.25% rupivocaine was then injected into the right anterior axillary line b/w the iliac crest and the twelfth rib under laparoscopic guidance. The layer between the tranversus abdominus and the internal oblique was targeted.   The kidney/ureter specimen was then placed into a 15 mm Endocatch II retrieval bag. The renal hilum, liver, adrenal bed and gonadal vein areas were each inspected and hemostasis was ensured with the pneomperitoneal pressures lowered. Surgicel was then placed over the hilum.  The camera  was then brought to the 12 mm port laterally and the camera port was removed and the fascia closed using a Leverne Humblesarter Thompson needle with a 0 Vicryl.  All the ports were then removed under visual guidance. The lateral 12 mm port and 5 mm port were  then connected sharply with a 15 blade. Then opened this incision down to the external oblique fascia. We then spread the muscle fibers down to the internal oblique fascia which we then opened with cautery. These were then spread and all muscle spared and the posterior peritoneum was opened. The rectus muscle was pulled medially. The specimen was then removed through these incision. The internal oblique fascia was then closed with a 0 Vicryl in a running fashion. The external oblique fascia was then closed with a 0 PDS in a running fashion.  All incisions were injected with local anesthetic and reapproximated at the skin with 4-0 monocryl sutures. Dermabond was applied to the skin. The patient tolerated the procedure well and without complications and was transferred to the recovery unit in satisfactory condition.   Crist FatBenjamin W. Herrick, M.D.

## 2016-01-16 NOTE — Anesthesia Procedure Notes (Signed)
Procedure Name: Intubation Date/Time: 01/16/2016 1:44 PM Performed by: Christerpher Clos, Nuala AlphaKRISTOPHER Pre-anesthesia Checklist: Patient identified, Emergency Drugs available, Suction available, Patient being monitored and Timeout performed Patient Re-evaluated:Patient Re-evaluated prior to inductionOxygen Delivery Method: Circle system utilized Preoxygenation: Pre-oxygenation with 100% oxygen Intubation Type: IV induction Ventilation: Mask ventilation without difficulty Laryngoscope Size: Mac and 4 Grade View: Grade II Tube type: Oral Tube size: 7.5 mm Number of attempts: 1 Airway Equipment and Method: Stylet Placement Confirmation: ETT inserted through vocal cords under direct vision,  positive ETCO2,  CO2 detector and breath sounds checked- equal and bilateral Secured at: 22 cm Tube secured with: Tape Dental Injury: Teeth and Oropharynx as per pre-operative assessment

## 2016-01-17 ENCOUNTER — Encounter (HOSPITAL_COMMUNITY): Payer: Self-pay | Admitting: Urology

## 2016-01-17 LAB — BASIC METABOLIC PANEL
Anion gap: 8 (ref 5–15)
BUN: 7 mg/dL (ref 6–20)
CHLORIDE: 104 mmol/L (ref 101–111)
CO2: 26 mmol/L (ref 22–32)
CREATININE: 0.93 mg/dL (ref 0.44–1.00)
Calcium: 8.5 mg/dL — ABNORMAL LOW (ref 8.9–10.3)
Glucose, Bld: 134 mg/dL — ABNORMAL HIGH (ref 65–99)
POTASSIUM: 3.4 mmol/L — AB (ref 3.5–5.1)
SODIUM: 138 mmol/L (ref 135–145)

## 2016-01-17 LAB — URINE CULTURE
CULTURE: NO GROWTH
Special Requests: NORMAL

## 2016-01-17 LAB — HEMOGLOBIN AND HEMATOCRIT, BLOOD
HEMATOCRIT: 35.2 % — AB (ref 36.0–46.0)
Hemoglobin: 12.3 g/dL (ref 12.0–15.0)

## 2016-01-17 MED ORDER — PHENOL 1.4 % MT LIQD
1.0000 | OROMUCOSAL | Status: DC | PRN
  Administered 2016-01-17: 1 via OROMUCOSAL
  Filled 2016-01-17: qty 177

## 2016-01-17 MED ORDER — ACETAMINOPHEN 325 MG PO TABS
650.0000 mg | ORAL_TABLET | ORAL | Status: DC | PRN
  Administered 2016-01-17: 650 mg via ORAL
  Filled 2016-01-17: qty 2

## 2016-01-17 NOTE — Discharge Summary (Signed)
Date of admission: 01/16/2016  Date of discharge: 01/17/2016  Admission diagnosis: right renal mass  Discharge diagnosis: s/p right radical nephrectomy for right renal mass  Secondary diagnoses:  Patient Active Problem List   Diagnosis Date Noted  . Renal mass, right 01/16/2016  . METHICILLIN SUSCEPTIBLE STAPH AUREUS SEPTICEMIA 09/26/2009  . SEPTIC ARTHRITIS 09/26/2009  . ACUTE OSTEOMYELITIS OTHER SPECIFIED SITE 09/26/2009  . ABSCESS 09/20/2009  . RINGWORM 05/01/2009  . SINUSITIS 02/28/2009  . UNSPECIFIED DISEASE OF SEBACEOUS GLANDS 05/22/2008  . 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 ANKLE SPRAIN, RIGHT 12/27/2007    History and Physical: For full details, please see admission history and physical. Briefly, Holly Keller is a 55 y.o. year old patient with enhancing right renal mass.   Hospital Course: Patient tolerated the procedure well.  She was then transferred to the floor after an uneventful PACU stay.  Her hospital course was uncomplicated.  On POD#1 she had met discharge criteria: was eating a regular diet, was up and ambulating independently,  pain was well controlled, was voiding without a catheter, and was ready to for discharge.   Laboratory values:   Recent Labs  01/14/16 1449 01/16/16 1650 01/17/16 0454  WBC 5.8  --   --   HGB 14.0 13.6 12.3  HCT 39.8 40.1 35.2*    Recent Labs  01/14/16 1443 01/17/16 0454  NA 140 138  K 3.6 3.4*  CL 105 104  CO2 26 26  GLUCOSE 95 134*  BUN 6 7  CREATININE 0.64 0.93  CALCIUM 9.0 8.5*   No results for input(s): LABPT, INR in the last 72 hours. No results for input(s): LABURIN in the last 72 hours. Results for orders placed or performed during the hospital encounter of 01/14/16  Urine culture     Status: Abnormal   Collection Time: 01/14/16  2:40 PM  Result Value Ref Range Status   Specimen Description URINE, CLEAN CATCH  Final   Special Requests NONE  Final   Culture MULTIPLE  SPECIES PRESENT, SUGGEST RECOLLECTION (A)  Final   Report Status 01/16/2016 FINAL  Final    Disposition: Home  Discharge instruction: The patient was instructed to be ambulatory but told to refrain from heavy lifting, strenuous activity, or driving.   Discharge medications: Allergies as of 01/17/2016   No Known Allergies     Medication List    TAKE these medications   docusate sodium 100 MG capsule Commonly known as:  COLACE Take 1 capsule (100 mg total) by mouth 2 (two) times daily.   fluticasone 50 MCG/ACT nasal spray Commonly known as:  FLONASE Place 2 sprays into both nostrils daily.   guaiFENesin 600 MG 12 hr tablet Commonly known as:  MUCINEX Take 600-1,200 mg by mouth 2 (two) times daily as needed (for cough/cold symptoms.).   ondansetron 4 MG tablet Commonly known as:  ZOFRAN Take 1 tablet (4 mg total) by mouth every 8 (eight) hours as needed for nausea or vomiting.   traMADol 50 MG tablet Commonly known as:  ULTRAM Take 1 tablet (50 mg total) by mouth every 6 (six) hours as needed.       Followup:  Follow-up Information    Ardis Hughs, MD On 01/29/2016.   Specialty:  Urology Why:  10:30pm Contact information: [address] [place] [address] [phone]

## 2016-01-17 NOTE — Progress Notes (Signed)
Urology Progress Note  1 Day Post-Op  Subjective: The patient is doing well.  No nausea or vomiting. Pain is adequately controlled.  Objective: Vital signs in last 24 hours: Temp:  [97.5 F (36.4 C)-98.8 F (37.1 C)] 98.8 F (37.1 C) (12/21 0537) Pulse Rate:  [56-77] 64 (12/21 0537) Resp:  [12-20] 15 (12/21 0537) BP: (135-168)/(72-97) 135/79 (12/21 0537) SpO2:  [94 %-100 %] 97 % (12/21 0537) Weight:  [82.1 kg (181 lb)] 82.1 kg (181 lb) (12/20 1056)  Intake/Output from previous day: 12/20 0701 - 12/21 0700 In: 5078.3 [P.O.:1320; I.V.:3458.3; IV Piggyback:300] Out: 3050 [Urine:2950; Blood:100] Intake/Output this shift: No intake/output data recorded.  Physical Exam:  General: Alert and oriented. CV: RRR Lungs: Clear bilaterally. GI: Soft, Nondistended. Incisions: Clean, dry, and intact Urine: Clear Extremities: Nontender, no erythema, no edema.  Lab Results:  Recent Labs  01/14/16 1449 01/16/16 1650 01/17/16 0454  HGB 14.0 13.6 12.3  HCT 39.8 40.1 35.2*      Assessment/Plan: POD#1 s/p lap right radical nephrectomy  1. D/c foley 2. Regular diet, med lock IVF 3. Ambulate, IS 4. Possible discharge later today   LOS: 1 day   Lanette Ell R 01/17/2016, 7:13 AM

## 2016-01-17 NOTE — Discharge Instructions (Signed)
·   Activity:  You are encouraged to ambulate frequently (about every hour during waking hours) to help prevent blood clots from forming in your legs or lungs.  However, you should not engage in any heavy lifting (> 10-15 lbs), strenuous activity, or straining.   Diet: You should advance your diet as instructed by your physician.  It will be normal to have some bloating, nausea, and abdominal discomfort intermittently.   Prescriptions:  You will be provided a prescription for pain medication to take as needed.  If your pain is not severe enough to require the prescription pain medication, you may take extra strength Tylenol instead which will have less side effects.  You should also take a prescribed stool softener to avoid straining with bowel movements as the prescription pain medication may constipate you.   Start Aspirin 81mg  daily on 01/18/16   Incisions: You may remove your dressing bandages 48 hours after surgery if not removed in the hospital.  You will either have some small staples or special tissue glue at each of the incision sites. Once the bandages are removed (if present), the incisions may stay open to air.  You may start showering (but not soaking or bathing in water) the 2nd day after surgery and the incisions simply need to be patted dry after the shower.  No additional care is needed.   What to call us about: You should call the office [phone]) if you develop fever > 101 or develop persistent vomiting. Activity:  You are encouraged to ambulate frequently (about every hour during waking hours) to help prevent blood clots from forming in your legs or lungs.  However, you should not engage in any heavy lifting (> 10-15 lbs), strenuous activity, or straining.

## 2016-04-09 ENCOUNTER — Encounter: Payer: Self-pay | Admitting: Emergency Medicine

## 2016-04-14 ENCOUNTER — Ambulatory Visit (INDEPENDENT_AMBULATORY_CARE_PROVIDER_SITE_OTHER): Payer: BC Managed Care – PPO | Admitting: Family Medicine

## 2016-04-14 VITALS — BP 128/76 | HR 63 | Temp 98.8°F | Ht 64.5 in | Wt 190.8 lb

## 2016-04-14 DIAGNOSIS — Z1159 Encounter for screening for other viral diseases: Secondary | ICD-10-CM

## 2016-04-14 DIAGNOSIS — IMO0002 Reserved for concepts with insufficient information to code with codable children: Secondary | ICD-10-CM

## 2016-04-14 DIAGNOSIS — M79671 Pain in right foot: Secondary | ICD-10-CM

## 2016-04-14 DIAGNOSIS — Z1329 Encounter for screening for other suspected endocrine disorder: Secondary | ICD-10-CM

## 2016-04-14 DIAGNOSIS — Z131 Encounter for screening for diabetes mellitus: Secondary | ICD-10-CM | POA: Diagnosis not present

## 2016-04-14 DIAGNOSIS — Z13220 Encounter for screening for lipoid disorders: Secondary | ICD-10-CM

## 2016-04-14 DIAGNOSIS — Q600 Renal agenesis, unilateral: Secondary | ICD-10-CM | POA: Diagnosis not present

## 2016-04-14 DIAGNOSIS — R3129 Other microscopic hematuria: Secondary | ICD-10-CM

## 2016-04-14 DIAGNOSIS — M79672 Pain in left foot: Secondary | ICD-10-CM

## 2016-04-14 DIAGNOSIS — Z905 Acquired absence of kidney: Secondary | ICD-10-CM

## 2016-04-14 LAB — POCT URINALYSIS DIP (MANUAL ENTRY)
BILIRUBIN UA: NEGATIVE
Bilirubin, UA: NEGATIVE
Glucose, UA: NEGATIVE
Leukocytes, UA: NEGATIVE
Nitrite, UA: NEGATIVE
PH UA: 6 (ref 5.0–8.0)
PROTEIN UA: NEGATIVE
SPEC GRAV UA: 1.02 (ref 1.030–1.035)
Urobilinogen, UA: 4 — AB (ref ?–2.0)

## 2016-04-14 LAB — HEPATITIS C ANTIBODY: HCV Ab: NEGATIVE

## 2016-04-14 NOTE — Progress Notes (Signed)
[facility] [address], [address] [place], [place] [id number] [phone]  Date:  04/14/2016   Name:  Holly RutherfordLaura A Keller   DOB:  Apr 16, 1960   MRN:  [id number]  PCP:  Abbe AmsterdamOPLAND,Chairty Toman, MD    Chief Complaint: Establish Care   History of Present Illness:  Holly RutherfordLaura A Mcbee is a 56 y.o. very pleasant female patient who presents with the following: Here today to establish as my primary care pt Her sister is a Engineer, manufacturinggeneral practice [place] in [place].  Had right kidney removed in December 2017, for suspected malignancy.  However happily her kidney was not cancerous and nothing further is needed in this regard except to monitor her kidney function.  Apparently her work- up started as she was noted to have microhematuria.  However pt reports that her microhematuria w/u was negative.  Bladder was normal.   Wants to stay on top of her kidney function, because she only has one now. Would like to check her kidnry function and urine today today She had chicken salad and yogurt, fruit today  Is seen by GYN Ivin Booty([place]).  She has an IUD (Mirena), this is her 2nd one.  Has had this one for 2-3 years.  Last GYN visit was July 2017.  All PAPs have been normal.    Distant history of bunion surgery- however her feet are bothering her again, right more than left. She has noted this again over the last 10 days.  Admits that she has started walking more recently and is not sure if this is the cause. She notes that she is more sedentary now than she was in the past and that this has likely led to some weight gain  In 2011 she had a staph infection that got into her blood stream; it infected her ?clavicle.  This has healed up fine- she was on abx for 6 weeks.  Recovered fully  Patient Active Problem List   Diagnosis Date Noted  . Renal mass, right 01/16/2016  . METHICILLIN SUSCEPTIBLE STAPH AUREUS SEPTICEMIA 09/26/2009  . SEPTIC ARTHRITIS 09/26/2009  . ACUTE OSTEOMYELITIS OTHER  SPECIFIED SITE 09/26/2009  . ABSCESS 09/20/2009  . RINGWORM 05/01/2009  . SINUSITIS 02/28/2009  . UNSPECIFIED DISEASE OF SEBACEOUS GLANDS 05/22/2008  . 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 ANKLE SPRAIN, RIGHT 12/27/2007    Past Medical History:  Diagnosis Date  . Heart murmur   . Recent URI    denies any fever, clear drainage from head  . Right renal mass     Past Surgical History:  Procedure Laterality Date  . FOOT SURGERY Bilateral    bunionectomy  . FRACTURE SURGERY Left    middle finger  . LAPAROSCOPIC NEPHRECTOMY Right 01/16/2016   Procedure: LAPAROSCOPIC RADICAL NEPHRECTOMY;  Surgeon: Crist FatBenjamin W Herrick, MD;  Location: [facility];  Service: Urology;  Laterality: Right;  . OTHER SURGICAL HISTORY Right    I&D right clavicle    Social History  Substance Use Topics  . Smoking status: Former Smoker    Packs/day: 0.25    Types: Cigarettes    Quit date: 01/27/2013  . Smokeless tobacco: Never Used     Comment: 6 cigarettes daily  . Alcohol use 4.2 oz/week    7 Glasses of wine per week    Family History  Problem Relation Age of Onset  . Alcoholism Other   . Arthritis Other   . Elevated Lipids Other   .  Hypertension Other     No Known Allergies  Medication list has been reviewed and updated.  Current Outpatient Prescriptions on File Prior to Visit  Medication Sig Dispense Refill  . docusate sodium (COLACE) 100 MG capsule Take 1 capsule (100 mg total) by mouth 2 (two) times daily. (Patient not taking: Reported on 04/14/2016) 60 capsule 11  . fluticasone (FLONASE) 50 MCG/ACT nasal spray Place 2 sprays into both nostrils daily.    Marland Kitchen guaiFENesin (MUCINEX) 600 MG 12 hr tablet Take 600-1,200 mg by mouth 2 (two) times daily as needed (for cough/cold symptoms.).     Marland Kitchen ondansetron (ZOFRAN) 4 MG tablet Take 1 tablet (4 mg total) by mouth every 8 (eight) hours as needed for nausea or vomiting. (Patient not taking: Reported on 04/14/2016) 20 tablet  0  . traMADol (ULTRAM) 50 MG tablet Take 1 tablet (50 mg total) by mouth every 6 (six) hours as needed. (Patient not taking: Reported on 04/14/2016) 21 tablet 0   No current facility-administered medications on file prior to visit.     Review of Systems:  As per HPI- otherwise negative.   Physical Examination: Vitals:   04/14/16 1504  BP: 128/76  Pulse: 63  Temp: 98.8 F (37.1 C)   Vitals:   04/14/16 1504  Weight: 190 lb 12.8 oz (86.5 kg)  Height: 5' 4.5" (1.638 m)   Body mass index is 32.24 kg/m. Ideal Body Weight: Weight in (lb) to have BMI = 25: 147.6  GEN: WDWN, NAD, Non-toxic, A & O x 3, overweight, looks well HEENT: Atraumatic, Normocephalic. Neck supple. No masses, No LAD.  Bilateral TM wnl, oropharynx normal.  PEERL,EOMI.   Ears and Nose: No external deformity. CV: RRR, No M/G/R. No JVD. No thrill. No extra heart sounds. PULM: CTA B, no wheezes, crackles, rhonchi. No retractions. No resp. distress. No accessory muscle use. ABD: S, NT, ND, +BS. No rebound. No HSM. EXTR: No c/c/e NEURO Normal gait.  PSYCH: Normally interactive. Conversant. Not depressed or anxious appearing.  Calm demeanor.  Both great toes s/p bunionectomy.  She notes tenderness and there is a bit of callus on the dorsal toe at the MCP joint.     Assessment and Plan:  Microhematuria - Plan: POCT urinalysis dipstick  Solitary kidney - Plan: Comprehensive metabolic panel, CBC  Screening for diabetes mellitus - Plan: Hemoglobin A1c  Screening for thyroid disorder - Plan: TSH  Screening for hyperlipidemia - Plan: Lipid panel  Encounter for hepatitis C screening test for low risk patient - Plan: Hepatitis C antibody  Foot pain, bilateral  Labs pending as above She continues to have known microhematuria Suspect her toe pain is actually due to soft tissue friction.  Suggested that she try a size up in her exercise shoes and she will let me know if not helpful   Results for orders placed or  performed in visit on 04/14/16  POCT urinalysis dipstick  Result Value Ref Range   Color, UA light yellow (A) yellow   Clarity, UA cloudy (A) clear   Glucose, UA negative negative   Bilirubin, UA negative negative   Ketones, POC UA negative negative   Spec Grav, UA 1.020 1.030 - 1.035   Blood, UA small (A) negative   pH, UA 6.0 5.0 - 8.0   Protein Ur, POC negative negative   Urobilinogen, UA 4.0 (A) Negative - 2.0   Nitrite, UA Negative Negative   Leukocytes, UA Negative Negative     Signed Abbe Amsterdam, MD

## 2016-04-14 NOTE — Patient Instructions (Signed)
It was nice to see you today- I will be in touch with your labs asap We will check your kidney function as well Try using slightly larger shoes; if this does not help please let me know and we can have you see podiatry We will plan your next visit pending your labs

## 2016-04-15 LAB — LIPID PANEL
CHOL/HDL RATIO: 3
Cholesterol: 193 mg/dL (ref 0–200)
HDL: 63.3 mg/dL (ref >39.00)
LDL CALC: 91 mg/dL (ref 0–99)
NonHDL: 129.6
Triglycerides: 192 mg/dL — ABNORMAL HIGH (ref 0.0–149.0)
VLDL: 38.4 mg/dL (ref 0.0–40.0)

## 2016-04-15 LAB — COMPREHENSIVE METABOLIC PANEL
ALK PHOS: 63 U/L (ref 39–117)
ALT: 19 U/L (ref 0–35)
AST: 17 U/L (ref 0–37)
Albumin: 4.2 g/dL (ref 3.5–5.2)
BUN: 25 mg/dL — AB (ref 6–23)
CO2: 26 mEq/L (ref 19–32)
CREATININE: 1.16 mg/dL (ref 0.40–1.20)
Calcium: 9.8 mg/dL (ref 8.4–10.5)
Chloride: 103 mEq/L (ref 96–112)
GFR: 51.8 mL/min — ABNORMAL LOW (ref >60.00)
GLUCOSE: 96 mg/dL (ref 70–99)
Potassium: 4.2 mEq/L (ref 3.5–5.1)
SODIUM: 137 meq/L (ref 135–145)
TOTAL PROTEIN: 7.3 g/dL (ref 6.0–8.3)
Total Bilirubin: 0.3 mg/dL (ref 0.2–1.2)

## 2016-04-15 LAB — CBC
HEMATOCRIT: 38.3 % (ref 36.0–46.0)
HEMOGLOBIN: 12.8 g/dL (ref 12.0–15.0)
MCHC: 33.5 g/dL (ref 30.0–36.0)
MCV: 96.8 fl (ref 78.0–100.0)
Platelets: 236 10*3/uL (ref 150.0–400.0)
RBC: 3.96 Mil/uL (ref 3.87–5.11)
RDW: 12.9 % (ref 11.5–15.5)
WBC: 6.5 10*3/uL (ref 4.0–10.5)

## 2016-04-15 LAB — HEMOGLOBIN A1C: Hgb A1c MFr Bld: 6 % (ref 4.6–6.5)

## 2016-04-15 LAB — TSH: TSH: 3.11 u[IU]/mL (ref 0.35–4.50)

## 2016-06-13 ENCOUNTER — Other Ambulatory Visit: Payer: Self-pay | Admitting: Obstetrics and Gynecology

## 2016-06-13 DIAGNOSIS — Z1231 Encounter for screening mammogram for malignant neoplasm of breast: Secondary | ICD-10-CM

## 2016-06-19 ENCOUNTER — Ambulatory Visit: Payer: BC Managed Care – PPO | Admitting: Family Medicine

## 2016-06-19 ENCOUNTER — Encounter: Payer: Self-pay | Admitting: Family Medicine

## 2016-06-19 ENCOUNTER — Ambulatory Visit (INDEPENDENT_AMBULATORY_CARE_PROVIDER_SITE_OTHER): Payer: BC Managed Care – PPO | Admitting: Family Medicine

## 2016-06-19 VITALS — BP 112/82 | HR 62 | Temp 98.3°F | Ht 64.5 in | Wt 175.4 lb

## 2016-06-19 DIAGNOSIS — M25562 Pain in left knee: Secondary | ICD-10-CM | POA: Diagnosis not present

## 2016-06-19 DIAGNOSIS — IMO0002 Reserved for concepts with insufficient information to code with codable children: Secondary | ICD-10-CM

## 2016-06-19 DIAGNOSIS — Q600 Renal agenesis, unilateral: Secondary | ICD-10-CM | POA: Diagnosis not present

## 2016-06-19 DIAGNOSIS — R7303 Prediabetes: Secondary | ICD-10-CM

## 2016-06-19 DIAGNOSIS — R634 Abnormal weight loss: Secondary | ICD-10-CM

## 2016-06-19 LAB — BASIC METABOLIC PANEL
BUN: 24 mg/dL — ABNORMAL HIGH (ref 6–23)
CALCIUM: 9.9 mg/dL (ref 8.4–10.5)
CO2: 25 mEq/L (ref 19–32)
Chloride: 106 mEq/L (ref 96–112)
Creatinine, Ser: 1.2 mg/dL (ref 0.40–1.20)
GFR: 49.78 mL/min — AB (ref >60.00)
GLUCOSE: 104 mg/dL — AB (ref 70–99)
POTASSIUM: 4.4 meq/L (ref 3.5–5.1)
SODIUM: 138 meq/L (ref 135–145)

## 2016-06-19 LAB — HEMOGLOBIN A1C: HEMOGLOBIN A1C: 6 % (ref 4.6–6.5)

## 2016-06-19 NOTE — Progress Notes (Addendum)
[facility] at [facility] [address] [place], [place] [address] [phone]  Date:  06/19/2016   Name:  Holly Keller   DOB:  29-Sep-1960   MRN:  [id number]  PCP:  Pearline Cables, MD    Chief Complaint: Follow-up (Pt here for f/u visit. )   History of Present Illness:  Holly Keller is a 56 y.o. very pleasant female patient who presents with the following:  Last seen by myself in March of this year- HPI from that visit  Had right kidney removed in December 2017, for suspected malignancy.  However happily her kidney was not cancerous and nothing further is needed in this regard except to monitor her kidney function.  Apparently her work- up started as she was noted to have microhematuria.  However pt reports that her microhematuria w/u was negative.  Bladder was normal.   Wants to stay on top of her kidney function, because she only has one now. Would like to check her kidnry function and urine today today She had chicken salad and yogurt, fruit today  Is seen by GYN Ivin Booty).  She has an IUD (Mirena), this is her 2nd one.  Has had this one for 2-3 years.  Last GYN visit was July 2017.  All PAPs have been normal.    Distant history of bunion surgery- however her feet are bothering her again, right more than left. She has noted this again over the last 10 days.  Admits that she has started walking more recently and is not sure if this is the cause. She notes that she is more sedentary now than she was in the past and that this has likely led to some weight gain  In 2011 she had a staph infection that got into her blood stream; it infected her ?clavicle.  This has healed up fine- she was on abx for 6 weeks.  Recovered fully  Needs to repeat her renal function today as her BUN/ creat were a bit higher than normal at last visit and she has a solitary kidney.   Weight loss: She used the [facility] diet book and tried to follow their  instructions  She has lost 15 lbs- she has been working on this as her last a1c was elevated.   She is trying to exercise more by walking, and also she has stopped drinking wine at night.  Overall she is quite pleased with her progress  She has noted some trouble with her RIGHT knee- it will sometimes seem to crack or pop.  occasionally will be sore if she has been exercising a lot- however this does tend to resolve over a day or so This is not all that troublesome to her but she does not want it to get in the way of her exercise program. Would like to go ahead and have her knee evaluated  She is seeing her OBG soon and thinks that they have a copy of her colonoscopy report- she will pass this along to me so I can help her see when her next screening is due She has had a colonoscopy, at some point prior to turning 56 yo Lab Results  Component Value Date   HGBA1C 6.0 04/14/2016    BP Readings from Last 3 Encounters:  06/19/16 112/82  04/14/16 128/76  01/17/16 135/79   Wt Readings from Last 3 Encounters:  06/19/16 175 lb 6.4 oz (79.6 kg)  04/14/16  190 lb 12.8 oz (86.5 kg)  01/16/16 181 lb (82.1 kg)     Patient Active Problem List   Diagnosis Date Noted  . Solitary kidney, acquired 04/14/2016  . Microhematuria 04/14/2016  . Renal mass, right 01/16/2016  . METHICILLIN SUSCEPTIBLE STAPH AUREUS SEPTICEMIA 09/26/2009  . SEPTIC ARTHRITIS 09/26/2009  . ACUTE OSTEOMYELITIS OTHER SPECIFIED SITE 09/26/2009  . ABSCESS 09/20/2009  . RINGWORM 05/01/2009  . SINUSITIS 02/28/2009  . UNSPECIFIED DISEASE OF SEBACEOUS GLANDS 05/22/2008  . 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 ANKLE SPRAIN, RIGHT 12/27/2007    Past Medical History:  Diagnosis Date  . Heart murmur   . Recent URI    denies any fever, clear drainage from head  . Right renal mass     Past Surgical History:  Procedure Laterality Date  . FOOT SURGERY Bilateral    bunionectomy  . FRACTURE  SURGERY Left    middle finger  . LAPAROSCOPIC NEPHRECTOMY Right 01/16/2016   Procedure: LAPAROSCOPIC RADICAL NEPHRECTOMY;  Surgeon: Crist FatBenjamin W Herrick, MD;  Location: [facility];  Service: Urology;  Laterality: Right;  . OTHER SURGICAL HISTORY Right    I&D right clavicle    Social History  Substance Use Topics  . Smoking status: Former Smoker    Packs/day: 0.25    Types: Cigarettes    Quit date: 01/27/2013  . Smokeless tobacco: Never Used     Comment: 6 cigarettes daily  . Alcohol use 4.2 oz/week    7 Glasses of wine per week    Family History  Problem Relation Age of Onset  . Alcoholism Other   . Arthritis Other   . Elevated Lipids Other   . Hypertension Other     No Known Allergies  Medication list has been reviewed and updated.  No current outpatient prescriptions on file prior to visit.   No current facility-administered medications on file prior to visit.     Review of Systems:  As per HPI- otherwise negative.   Physical Examination: Vitals:   06/19/16 0919  BP: 112/82  Pulse: 62  Temp: 98.3 F (36.8 C)   Vitals:   06/19/16 0919  Weight: 175 lb 6.4 oz (79.6 kg)  Height: 5' 4.5" (1.638 m)   Body mass index is 29.64 kg/m. Ideal Body Weight: Weight in (lb) to have BMI = 25: 147.6  GEN: WDWN, NAD, Non-toxic, A & O x 3, overweight but has lost, looks well HEENT: Atraumatic, Normocephalic. Neck supple. No masses, No LAD. Ears and Nose: No external deformity. CV: RRR, No M/G/R. No JVD. No thrill. No extra heart sounds. PULM: CTA B, no wheezes, crackles, rhonchi. No retractions. No resp. distress. No accessory muscle use. ABD: S, NT, ND, +BS. No rebound. No HSM. EXTR: No c/c/e NEURO Normal gait.  PSYCH: Normally interactive. Conversant. Not depressed or anxious appearing.  Calm demeanor.  Right knee with normal ROM, some mild crepitus.  No effusion, heat or bruising ligaments are stable   Assessment and Plan: Solitary kidney - Plan: Basic metabolic  panel  Weight loss  Pre-diabetes - Plan: Hemoglobin A1c  Acute pain of left knee - Plan: Ambulatory referral to Sports Medicine  congratulated on her weight loss- she will continue to work on this She would like to go ahead and repeat her A1c today which is fine Also follow-up on her renal function Referral to sports med for her knee   Signed Abbe AmsterdamJessica Copland, MD  Labs  Results for orders placed or  performed in visit on 06/19/16  Basic metabolic panel  Result Value Ref Range   Sodium 138 135 - 145 mEq/L   Potassium 4.4 3.5 - 5.1 mEq/L   Chloride 106 96 - 112 mEq/L   CO2 25 19 - 32 mEq/L   Glucose, Bld 104 (H) 70 - 99 mg/dL   BUN 24 (H) 6 - 23 mg/dL   Creatinine, Ser 1.61 0.40 - 1.20 mg/dL   Calcium 9.9 8.4 - 09.6 mg/dL   GFR 04.54 (L) >09.81 mL/min  Hemoglobin A1c  Result Value Ref Range   Hgb A1c MFr Bld 6.0 4.6 - 6.5 %

## 2016-06-19 NOTE — Patient Instructions (Addendum)
Great job with weight loss!  We will check your A1c and kidney function today You might try a knee sleeve for support as needed We will have you see Dr. Pearletha ForgeHudnall to take a look at your knee and give you some advice about how to take care of it.  I will be in touch with your labs asap Please ask Okey RegalCarol for a copy of your most recent colonoscopy and pass it along to me; I can help you figure out when are are due for another colon cancer screening

## 2016-06-30 ENCOUNTER — Ambulatory Visit
Admission: RE | Admit: 2016-06-30 | Discharge: 2016-06-30 | Disposition: A | Discharge status: Home / Self Care | Payer: BC Managed Care – PPO | Source: Ambulatory Visit | Attending: Obstetrics and Gynecology | Admitting: Obstetrics and Gynecology

## 2016-06-30 DIAGNOSIS — Z1231 Encounter for screening mammogram for malignant neoplasm of breast: Secondary | ICD-10-CM

## 2017-05-03 NOTE — Progress Notes (Signed)
Duplicate

## 2017-05-04 ENCOUNTER — Encounter: Payer: BC Managed Care – PPO | Admitting: Family Medicine

## 2017-05-06 ENCOUNTER — Ambulatory Visit (INDEPENDENT_AMBULATORY_CARE_PROVIDER_SITE_OTHER): Payer: BC Managed Care – PPO | Admitting: Family Medicine

## 2017-05-06 ENCOUNTER — Encounter: Payer: Self-pay | Admitting: Family Medicine

## 2017-05-06 VITALS — BP 138/84 | HR 87 | Resp 16 | Ht 64.0 in | Wt 187.4 lb

## 2017-05-06 DIAGNOSIS — IMO0002 Reserved for concepts with insufficient information to code with codable children: Secondary | ICD-10-CM

## 2017-05-06 DIAGNOSIS — Q600 Renal agenesis, unilateral: Secondary | ICD-10-CM

## 2017-05-06 DIAGNOSIS — Z23 Encounter for immunization: Secondary | ICD-10-CM | POA: Diagnosis not present

## 2017-05-06 DIAGNOSIS — R3129 Other microscopic hematuria: Secondary | ICD-10-CM | POA: Diagnosis not present

## 2017-05-06 DIAGNOSIS — R7303 Prediabetes: Secondary | ICD-10-CM

## 2017-05-06 DIAGNOSIS — Z13220 Encounter for screening for lipoid disorders: Secondary | ICD-10-CM | POA: Diagnosis not present

## 2017-05-06 DIAGNOSIS — Z0000 Encounter for general adult medical examination without abnormal findings: Secondary | ICD-10-CM

## 2017-05-06 NOTE — Progress Notes (Addendum)
[facility] at [facility] [address] [place], [place] [address] [phone]  Date:  05/06/2017   Name:  Holly Keller   DOB:  12/22/1960   MRN:  [id number]  PCP:  Pearline Cables, MD    Chief Complaint: Annual Exam   History of Present Illness:  LYNNETTA Keller is a 57 y.o. very pleasant female patient who presents with the following:  Here today for a CPE History of solitary kidney, factor V def S/p RIGHT nephrectomy in 2017 From our visit last March (2018): Here today to establish as my primary care pt Her sister is a Engineer, manufacturing MD in [place]. Had right kidney removed in December 2017, for suspected malignancy.  However happily her kidney was not cancerous and nothing further is needed in this regard except to monitor her kidney function.  Apparently her work- up started as she was noted to have microhematuria.  However pt reports that her microhematuria w/u was negative.  Bladder was normal.  Wants to stay on top of her kidney function, because she only has one now. Would like to check her kidnry function and urine today today Is seen by GYN Ivin Booty).  She has an IUD (Mirena), this is her 2nd one.  Has had this one for 2-3 years.  Last GYN visit was July 2017.  All PAPs have been normal.   Distant history of bunion surgery- however her feet are bothering her again, right more than left. She has noted this again over the last 10 days.  Admits that she has started walking more recently and is not sure if this is the cause. She notes that she is more sedentary now than she was in the past and that this has likely led to some weight gain In 2011 she had a staph infection that got into her blood stream; it infected her ?clavicle.  This has healed up fine- she was on abx for 6 weeks.  Recovered fully  We had her come back last May to repeat her renal function She also had lost some weight at our last visit by following a [facility]  healthy eating book From our lab noted in May: At this point I think you are ok to continue using the occasional NSAID (for headaches, etc). However if you need a daily pain reliever- for arthritis as an example- I would prefer you to use tylenol  BP Readings from Last 3 Encounters:  06/19/16 : 112/82  04/14/16 : 128/76  01/17/16 : 135/79  Your blood pressure looks fine, no need to change your salt intake at this time  The question of when to see a nephrologist is up to your comfort level. I do not think they would want to do anything in particular at this point, but you will probably eventually need monitoring. If you want to get established now that is fine. Otherwise we can continue to monitor your renal function every 6 months or so and see how it goes. Let me know what you prefer   Pap: GYN 7/17 Mammo: 6/18 Colon: she has a family history of colon polyps and has had 2 colonoscopies. She cannot remember who is her GI doc but they will contact her for follow-up tdap date is uncertain. She had previously estimated that she had a tetanus in 2016,but thinking again she does not think this is correct, and her last shot may have been in 2005. We  will boost for her today   Will do labs today- she had some healthy foods so far today  Things have been a bit hectic recently-they rented out their house for furniture market   Her husband notes that she snores more the last couple of years He has not commented on any apnea She does tend to feel tired and like she could use a nap a lot of the time She sleeps 5-6 hours a night due to not having time to sleep for a longer period  She notes that she may have some pain in her back or hips sometimes, but this will come and go and does not seem to be anything serious to her, but she wanted to mention it   Lab Results  Component Value Date   HGBA1C 6.0 06/19/2016    Patient Active Problem List   Diagnosis Date Noted  . Solitary kidney, acquired  04/14/2016  . Microhematuria 04/14/2016  . Renal mass, right 01/16/2016  . METHICILLIN SUSCEPTIBLE STAPH AUREUS SEPTICEMIA 09/26/2009  . SEPTIC ARTHRITIS 09/26/2009  . ACUTE OSTEOMYELITIS OTHER SPECIFIED SITE 09/26/2009  . UNSPECIFIED DISEASE OF SEBACEOUS GLANDS 05/22/2008  . 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 ANKLE SPRAIN, RIGHT 12/27/2007    Past Medical History:  Diagnosis Date  . Heart murmur   . Recent URI    denies any fever, clear drainage from head  . Right renal mass     Past Surgical History:  Procedure Laterality Date  . FOOT SURGERY Bilateral    bunionectomy  . FRACTURE SURGERY Left    middle finger  . LAPAROSCOPIC NEPHRECTOMY Right 01/16/2016   Procedure: LAPAROSCOPIC RADICAL NEPHRECTOMY;  Surgeon: Crist Fat, MD;  Location: [facility];  Service: Urology;  Laterality: Right;  . OTHER SURGICAL HISTORY Right    I&D right clavicle    Social History   Tobacco Use  . Smoking status: Former Smoker    Packs/day: 0.25    Types: Cigarettes    Last attempt to quit: 01/27/2013    Years since quitting: 4.2  . Smokeless tobacco: Never Used  Substance Use Topics  . Alcohol use: Yes    Alcohol/week: 4.2 oz    Types: 7 Glasses of wine per week  . Drug use: No    Family History  Problem Relation Age of Onset  . Alcoholism Other   . Arthritis Other   . Elevated Lipids Other   . Hypertension Other     No Known Allergies  Medication list has been reviewed and updated.  No current outpatient medications on file prior to visit.   No current facility-administered medications on file prior to visit.     Review of Systems:  As per HPI- otherwise negative.   Physical Examination: Vitals:   05/06/17 1502  BP: 138/84  Pulse: 87  Resp: 16  SpO2: 99%   Vitals:   05/06/17 1502  Weight: 187 lb 6.4 oz (85 kg)  Height: 5\' 4"  (1.626 m)   Body mass index is 32.17 kg/m. Ideal Body Weight: Weight in (lb) to have BMI =  25: 145.3  GEN: WDWN, NAD, Non-toxic, A & O x 3, obese, looks well  HEENT: Atraumatic, Normocephalic. Neck supple. No masses, No LAD. Ears and Nose: No external deformity. CV: RRR, No M/G/R. No JVD. No thrill. No extra heart sounds. PULM: CTA B, no wheezes, crackles, rhonchi. No retractions. No resp. distress. No accessory muscle use. ABD: S, NT, ND, +BS.  No rebound. No HSM. Healed RLQ scar from her nephrectomy No tenderness in her back or hips today on exam. Hips with normal ROM  EXTR: No c/c/e NEURO Normal gait.  PSYCH: Normally interactive. Conversant. Not depressed or anxious appearing.  Calm demeanor.    Assessment and Plan: Physical exam  Solitary kidney - Plan: CBC, Comprehensive metabolic panel  Screening for hyperlipidemia - Plan: Lipid panel  Pre-diabetes - Plan: Comprehensive metabolic panel, Hemoglobin A1c  Microhematuria  Immunization due - Plan: Tdap vaccine greater than or equal to 7yo IM  Here today for a CPE Labs pending as above tdap today so we will be sure this is UTD Will plan further follow- up pending labs. Will check on her renal function today discussed her feeling tired sleepy and also snoring.   It sounds as though she is just not getting enough sleep, but also consider OSA.  Offered a sleep study now.  She is going on vacation next week and should be able to catch up on rest.  She will let me know if she continues to feel tired after her time off  Lab Results  Component Value Date   TSH 3.11 04/14/2016  recent thyroid was normal   Signed Abbe AmsterdamJessica Kayci [place], MD  Received her labs 4/11, message to pt:  Your blood count is normal Metabolic profile looks great A1c is still in the pre-diabetes range.  Stable since last year Cholesterol looks good!  Take care and we can plan for a physical in one year If you continue to feel tired after your vacation please alert me and I will set up a sleep study for you Results for orders placed or performed in  visit on 05/06/17  CBC  Result Value Ref Range   WBC 6.9 4.0 - 10.5 K/uL   RBC 4.05 3.87 - 5.11 Mil/uL   Platelets 263.0 150.0 - 400.0 K/uL   Hemoglobin 12.9 12.0 - 15.0 g/dL   HCT 16.139.1 09.636.0 - 04.546.0 %   MCV 96.6 78.0 - 100.0 fl   MCHC 33.1 30.0 - 36.0 g/dL   RDW 40.912.4 81.111.5 - 91.415.5 %  Comprehensive metabolic panel  Result Value Ref Range   Sodium 135 135 - 145 mEq/L   Potassium 4.1 3.5 - 5.1 mEq/L   Chloride 100 96 - 112 mEq/L   CO2 27 19 - 32 mEq/L   Glucose, Bld 90 70 - 99 mg/dL   BUN 16 6 - 23 mg/dL   Creatinine, Ser 7.820.97 0.40 - 1.20 mg/dL   Total Bilirubin 0.4 0.2 - 1.2 mg/dL   Alkaline Phosphatase 65 39 - 117 U/L   AST 18 0 - 37 U/L   ALT 22 0 - 35 U/L   Total Protein 7.8 6.0 - 8.3 g/dL   Albumin 4.5 3.5 - 5.2 g/dL   Calcium 9.7 8.4 - 95.610.5 mg/dL   GFR 21.3063.42 >86.57>60.00 mL/min  Hemoglobin A1c  Result Value Ref Range   Hgb A1c MFr Bld 6.1 4.6 - 6.5 %  Lipid panel  Result Value Ref Range   Cholesterol 187 0 - 200 mg/dL   Triglycerides 846.9121.0 0.0 - 149.0 mg/dL   HDL 62.9574.90 >28.41>39.00 mg/dL   VLDL 32.424.2 0.0 - 40.140.0 mg/dL   LDL Cholesterol 88 0 - 99 mg/dL   Total CHOL/HDL Ratio 3    NonHDL 112.48

## 2017-05-06 NOTE — Patient Instructions (Addendum)
It was nice to see you today- I will be in touch with your labs asap  Your fatigue may be due to not getting enough rest, although you might also have some element of sleep apnea Please do try to prioritize sleep and make sure you are getting at least 7 hours a night. Naps can be a good way to catch up If you are still feeling tired/ sleepy after your upcoming vacation please alert me and I will arrange for a sleep study for you  You got your Tdap tetanus booster today- you will be due for a plain tetanus shot in 10 years   Continue to try and get exercise most days of the week  Health Maintenance, Female Adopting a healthy lifestyle and getting preventive care can go a long way to promote health and wellness. Talk with your health care provider about what schedule of regular examinations is right for you. This is a good chance for you to check in with your provider about disease prevention and staying healthy. In between checkups, there are plenty of things you can do on your own. Experts have done a lot of research about which lifestyle changes and preventive measures are most likely to keep you healthy. Ask your health care provider for more information. Weight and diet Eat a healthy diet  Be sure to include plenty of vegetables, fruits, low-fat dairy products, and lean protein.  Do not eat a lot of foods high in solid fats, added sugars, or salt.  Get regular exercise. This is one of the most important things you can do for your health. ? Most adults should exercise for at least 150 minutes each week. The exercise should increase your heart rate and make you sweat (moderate-intensity exercise). ? Most adults should also do strengthening exercises at least twice a week. This is in addition to the moderate-intensity exercise.  Maintain a healthy weight  Body mass index (BMI) is a measurement that can be used to identify possible weight problems. It estimates body fat based on height and  weight. Your health care provider can help determine your BMI and help you achieve or maintain a healthy weight.  For females 31 years of age and older: ? A BMI below 18.5 is considered underweight. ? A BMI of 18.5 to 24.9 is normal. ? A BMI of 25 to 29.9 is considered overweight. ? A BMI of 30 and above is considered obese.  Watch levels of cholesterol and blood lipids  You should start having your blood tested for lipids and cholesterol at 55 years of age, then have this test every 5 years.  You may need to have your cholesterol levels checked more often if: ? Your lipid or cholesterol levels are high. ? You are older than 55 years of age. ? You are at high risk for heart disease.  Cancer screening Lung Cancer  Lung cancer screening is recommended for adults 19-97 years old who are at high risk for lung cancer because of a history of smoking.  A yearly low-dose CT scan of the lungs is recommended for people who: ? Currently smoke. ? Have quit within the past 15 years. ? Have at least a 30-pack-year history of smoking. A pack year is smoking an average of one pack of cigarettes a day for 1 year.  Yearly screening should continue until it has been 15 years since you quit.  Yearly screening should stop if you develop a health problem that would prevent you  from having lung cancer treatment.  Breast Cancer  Practice breast self-awareness. This means understanding how your breasts normally appear and feel.  It also means doing regular breast self-exams. Let your health care provider know about any changes, no matter how small.  If you are in your 20s or 30s, you should have a clinical breast exam (CBE) by a health care provider every 1-3 years as part of a regular health exam.  If you are 83 or older, have a CBE every year. Also consider having a breast X-ray (mammogram) every year.  If you have a family history of breast cancer, talk to your health care provider about genetic  screening.  If you are at high risk for breast cancer, talk to your health care provider about having an MRI and a mammogram every year.  Breast cancer gene (BRCA) assessment is recommended for women who have family members with BRCA-related cancers. BRCA-related cancers include: ? Breast. ? Ovarian. ? Tubal. ? Peritoneal cancers.  Results of the assessment will determine the need for genetic counseling and BRCA1 and BRCA2 testing.  Cervical Cancer Your health care provider may recommend that you be screened regularly for cancer of the pelvic organs (ovaries, uterus, and vagina). This screening involves a pelvic examination, including checking for microscopic changes to the surface of your cervix (Pap test). You may be encouraged to have this screening done every 3 years, beginning at age 53.  For women ages 2-65, health care providers may recommend pelvic exams and Pap testing every 3 years, or they may recommend the Pap and pelvic exam, combined with testing for human papilloma virus (HPV), every 5 years. Some types of HPV increase your risk of cervical cancer. Testing for HPV may also be done on women of any age with unclear Pap test results.  Other health care providers may not recommend any screening for nonpregnant women who are considered low risk for pelvic cancer and who do not have symptoms. Ask your health care provider if a screening pelvic exam is right for you.  If you have had past treatment for cervical cancer or a condition that could lead to cancer, you need Pap tests and screening for cancer for at least 20 years after your treatment. If Pap tests have been discontinued, your risk factors (such as having a new sexual partner) need to be reassessed to determine if screening should resume. Some women have medical problems that increase the chance of getting cervical cancer. In these cases, your health care provider may recommend more frequent screening and Pap  tests.  Colorectal Cancer  This type of cancer can be detected and often prevented.  Routine colorectal cancer screening usually begins at 55 years of age and continues through 55 years of age.  Your health care provider may recommend screening at an earlier age if you have risk factors for colon cancer.  Your health care provider may also recommend using home test kits to check for hidden blood in the stool.  A small camera at the end of a tube can be used to examine your colon directly (sigmoidoscopy or colonoscopy). This is done to check for the earliest forms of colorectal cancer.  Routine screening usually begins at age 40.  Direct examination of the colon should be repeated every 5-10 years through 55 years of age. However, you may need to be screened more often if early forms of precancerous polyps or small growths are found.  Skin Cancer  Check your skin from  head to toe regularly.  Tell your health care provider about any new moles or changes in moles, especially if there is a change in a mole's shape or color.  Also tell your health care provider if you have a mole that is larger than the size of a pencil eraser.  Always use sunscreen. Apply sunscreen liberally and repeatedly throughout the day.  Protect yourself by wearing long sleeves, pants, a wide-brimmed hat, and sunglasses whenever you are outside.  Heart disease, diabetes, and high blood pressure  High blood pressure causes heart disease and increases the risk of stroke. High blood pressure is more likely to develop in: ? People who have blood pressure in the high end of the normal range (130-139/85-89 mm Hg). ? People who are overweight or obese. ? People who are African American.  If you are 44-64 years of age, have your blood pressure checked every 3-5 years. If you are 59 years of age or older, have your blood pressure checked every year. You should have your blood pressure measured twice-once when you are at  a hospital or clinic, and once when you are not at a hospital or clinic. Record the average of the two measurements. To check your blood pressure when you are not at a hospital or clinic, you can use: ? An automated blood pressure machine at a pharmacy. ? A home blood pressure monitor.  If you are between 38 years and 28 years old, ask your health care provider if you should take aspirin to prevent strokes.  Have regular diabetes screenings. This involves taking a blood sample to check your fasting blood sugar level. ? If you are at a normal weight and have a low risk for diabetes, have this test once every three years after 55 years of age. ? If you are overweight and have a high risk for diabetes, consider being tested at a younger age or more often. Preventing infection Hepatitis B  If you have a higher risk for hepatitis B, you should be screened for this virus. You are considered at high risk for hepatitis B if: ? You were born in a country where hepatitis B is common. Ask your health care provider which countries are considered high risk. ? Your parents were born in a high-risk country, and you have not been immunized against hepatitis B (hepatitis B vaccine). ? You have HIV or AIDS. ? You use needles to inject street drugs. ? You live with someone who has hepatitis B. ? You have had sex with someone who has hepatitis B. ? You get hemodialysis treatment. ? You take certain medicines for conditions, including cancer, organ transplantation, and autoimmune conditions.  Hepatitis C  Blood testing is recommended for: ? Everyone born from [birth year] through 1963. ? Anyone with known risk factors for hepatitis C.  Sexually transmitted infections (STIs)  You should be screened for sexually transmitted infections (STIs) including gonorrhea and chlamydia if: ? You are sexually active and are younger than 55 years of age. ? You are older than 55 years of age and your health care provider tells  you that you are at risk for this type of infection. ? Your sexual activity has changed since you were last screened and you are at an increased risk for chlamydia or gonorrhea. Ask your health care provider if you are at risk.  If you do not have HIV, but are at risk, it may be recommended that you take a prescription medicine daily to prevent  HIV infection. This is called pre-exposure prophylaxis (PrEP). You are considered at risk if: ? You are sexually active and do not regularly use condoms or know the HIV status of your partner(s). ? You take drugs by injection. ? You are sexually active with a partner who has HIV.  Talk with your health care provider about whether you are at high risk of being infected with HIV. If you choose to begin PrEP, you should first be tested for HIV. You should then be tested every 3 months for as long as you are taking PrEP. Pregnancy  If you are premenopausal and you may become pregnant, ask your health care provider about preconception counseling.  If you may become pregnant, take 400 to 800 micrograms (mcg) of folic acid every day.  If you want to prevent pregnancy, talk to your health care provider about birth control (contraception). Osteoporosis and menopause  Osteoporosis is a disease in which the bones lose minerals and strength with aging. This can result in serious bone fractures. Your risk for osteoporosis can be identified using a bone density scan.  If you are 83 years of age or older, or if you are at risk for osteoporosis and fractures, ask your health care provider if you should be screened.  Ask your health care provider whether you should take a calcium or vitamin D supplement to lower your risk for osteoporosis.  Menopause may have certain physical symptoms and risks.  Hormone replacement therapy may reduce some of these symptoms and risks. Talk to your health care provider about whether hormone replacement therapy is right for  you. Follow these instructions at home:  Schedule regular health, dental, and eye exams.  Stay current with your immunizations.  Do not use any tobacco products including cigarettes, chewing tobacco, or electronic cigarettes.  If you are pregnant, do not drink alcohol.  If you are breastfeeding, limit how much and how often you drink alcohol.  Limit alcohol intake to no more than 1 drink per day for nonpregnant women. One drink equals 12 ounces of beer, 5 ounces of wine, or 1 ounces of hard liquor.  Do not use street drugs.  Do not share needles.  Ask your health care provider for help if you need support or information about quitting drugs.  Tell your health care provider if you often feel depressed.  Tell your health care provider if you have ever been abused or do not feel safe at home. This information is not intended to replace advice given to you by your health care provider. Make sure you discuss any questions you have with your health care provider. Document Released: 07/29/2010 Document Revised: 06/21/2015 Document Reviewed: 10/17/2014 Elsevier Interactive Patient Education  [employer].

## 2017-05-07 ENCOUNTER — Encounter: Payer: Self-pay | Admitting: Family Medicine

## 2017-05-07 LAB — HEMOGLOBIN A1C: Hgb A1c MFr Bld: 6.1 % (ref 4.6–6.5)

## 2017-05-07 LAB — COMPREHENSIVE METABOLIC PANEL
ALK PHOS: 65 U/L (ref 39–117)
ALT: 22 U/L (ref 0–35)
AST: 18 U/L (ref 0–37)
Albumin: 4.5 g/dL (ref 3.5–5.2)
BUN: 16 mg/dL (ref 6–23)
CO2: 27 meq/L (ref 19–32)
Calcium: 9.7 mg/dL (ref 8.4–10.5)
Chloride: 100 mEq/L (ref 96–112)
Creatinine, Ser: 0.97 mg/dL (ref 0.40–1.20)
GFR: 63.42 mL/min (ref >60.00)
GLUCOSE: 90 mg/dL (ref 70–99)
Potassium: 4.1 mEq/L (ref 3.5–5.1)
SODIUM: 135 meq/L (ref 135–145)
Total Bilirubin: 0.4 mg/dL (ref 0.2–1.2)
Total Protein: 7.8 g/dL (ref 6.0–8.3)

## 2017-05-07 LAB — CBC
HCT: 39.1 % (ref 36.0–46.0)
Hemoglobin: 12.9 g/dL (ref 12.0–15.0)
MCHC: 33.1 g/dL (ref 30.0–36.0)
MCV: 96.6 fl (ref 78.0–100.0)
Platelets: 263 10*3/uL (ref 150.0–400.0)
RBC: 4.05 Mil/uL (ref 3.87–5.11)
RDW: 12.4 % (ref 11.5–15.5)
WBC: 6.9 10*3/uL (ref 4.0–10.5)

## 2017-05-07 LAB — LIPID PANEL
CHOL/HDL RATIO: 3
Cholesterol: 187 mg/dL (ref 0–200)
HDL: 74.9 mg/dL (ref >39.00)
LDL CALC: 88 mg/dL (ref 0–99)
NONHDL: 112.48
TRIGLYCERIDES: 121 mg/dL (ref 0.0–149.0)
VLDL: 24.2 mg/dL (ref 0.0–40.0)

## 2017-08-18 ENCOUNTER — Other Ambulatory Visit: Payer: Self-pay | Admitting: Obstetrics & Gynecology

## 2017-08-18 DIAGNOSIS — Z1231 Encounter for screening mammogram for malignant neoplasm of breast: Secondary | ICD-10-CM

## 2017-09-15 ENCOUNTER — Ambulatory Visit: Payer: BC Managed Care – PPO

## 2017-09-17 ENCOUNTER — Ambulatory Visit
Admission: RE | Admit: 2017-09-17 | Discharge: 2017-09-17 | Disposition: A | Discharge status: Home / Self Care | Payer: BC Managed Care – PPO | Source: Ambulatory Visit | Attending: Obstetrics & Gynecology | Admitting: Obstetrics & Gynecology

## 2017-09-17 DIAGNOSIS — Z1231 Encounter for screening mammogram for malignant neoplasm of breast: Secondary | ICD-10-CM

## 2017-12-11 ENCOUNTER — Ambulatory Visit: Payer: Self-pay

## 2017-12-11 ENCOUNTER — Encounter: Payer: Self-pay | Admitting: Family Medicine

## 2017-12-11 NOTE — Telephone Encounter (Signed)
Patient called and she says she went in to give blood and a trauma nurse took her pulse at her wrist and was told she has a heart murmur. She says she told the nurse that she's been having a murmur since a child and her doctor is aware of it. The patient says she wants to know if a heart murmur can be felt at the wrist and if she will need an EKG. I advised the way to determine if a patient has a heart murmur, the heart will need to be heard through a stethoscope. I asked is she having any symptoms, she denies. I advised I will send this to the provider and if she needs to have an EKG, someone will call with the appointment, she verbalized understanding.

## 2018-04-22 ENCOUNTER — Telehealth: Payer: Self-pay | Admitting: *Deleted

## 2018-04-22 NOTE — Telephone Encounter (Signed)
Received Colonoscopy Results from [facility]; forwarded to provider/SLS 03/26

## 2018-08-17 ENCOUNTER — Other Ambulatory Visit: Payer: Self-pay | Admitting: Obstetrics & Gynecology

## 2018-08-17 DIAGNOSIS — Z1231 Encounter for screening mammogram for malignant neoplasm of breast: Secondary | ICD-10-CM

## 2018-08-23 ENCOUNTER — Encounter: Payer: Self-pay | Admitting: Family Medicine

## 2018-08-23 ENCOUNTER — Telehealth: Payer: Self-pay

## 2018-08-23 NOTE — Telephone Encounter (Signed)
Copied from [place] [phone]. Topic: Appointment Scheduling - Scheduling Inquiry for Clinic >> Aug 23, 2018  2:52 PM Erick Blinks wrote: Reason for CRM: Pt is requesting to have labs before CPE. Please advise if possible.  Best contact: [phone]

## 2018-08-24 ENCOUNTER — Encounter: Payer: Self-pay | Admitting: Family Medicine

## 2018-08-24 NOTE — Progress Notes (Addendum)
[facility] [address], [address] [place], [place] [address] [phone] [phone]  Date:  08/25/2018   Name:  Holly Keller   DOB:  Jun 24, 1960   MRN:  [id number]  PCP:  Pearline Cablesopland, Oran Dillenburg C, MD    Chief Complaint: Annual Exam (sees obgyn for pap)   History of Present Illness:  Holly Keller is a 58 y.o. very pleasant female patient who presents with the following:  Here today for complete physical Last by myself in April 2019 Holly RiegerLaura has solitary kidney, she underwent nephrectomy in 2017 for suspected malignancy.  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 Ivin Bootyrews  Pap: 2017, due this year- GYN Mammogram: August 19- scheduled already  Colonoscopy: Done in March Immunizations: Can suggest Shingrix- she would like to start today She did have shingles years ago Labs: A year ago, fasting today   She is walking about 4 miles a day with a 20 lb backpack for exercise  Wt Readings from Last 3 Encounters:  08/25/18 188 lb (85.3 kg)  05/06/17 187 lb 6.4 oz (85 kg)  06/19/16 175 lb 6.4 oz (79.6 kg)   No CP or SOB with exercise  She would like to lose some weight Patient Active Problem List   Diagnosis Date Noted  . Solitary kidney, acquired 04/14/2016  . Microhematuria 04/14/2016  . Renal mass, right 01/16/2016  . METHICILLIN SUSCEPTIBLE STAPH AUREUS SEPTICEMIA 09/26/2009  . SEPTIC ARTHRITIS 09/26/2009  . ACUTE OSTEOMYELITIS OTHER SPECIFIED SITE 09/26/2009  . UNSPECIFIED DISEASE OF SEBACEOUS GLANDS 05/22/2008  . Factor 5 Leiden mutation, heterozygous (HCC) 12/27/2007  . 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 ANKLE SPRAIN, RIGHT 12/27/2007    Past Medical History:  Diagnosis Date  . Heart murmur   . Recent URI    denies any fever, clear drainage from head  . Right renal mass     Past Surgical History:  Procedure  Laterality Date  . FOOT SURGERY Bilateral    bunionectomy  . FRACTURE SURGERY Left    middle finger  . LAPAROSCOPIC NEPHRECTOMY Right 01/16/2016   Procedure: LAPAROSCOPIC RADICAL NEPHRECTOMY;  Surgeon: Crist FatBenjamin W Herrick, MD;  Location: [facility];  Service: Urology;  Laterality: Right;  . OTHER SURGICAL HISTORY Right    I&D right clavicle    Social History   Tobacco Use  . Smoking status: Former Smoker    Packs/day: 0.25    Types: Cigarettes    Quit date: 01/27/2013    Years since quitting: 5.5  . Smokeless tobacco: Never Used  Substance Use Topics  . Alcohol use: Yes    Alcohol/week: 7.0 standard drinks    Types: 7 Glasses of wine per week  . Drug use: No    Family History  Problem Relation Age of Onset  . Alcoholism Other   . Arthritis Other   . Elevated Lipids Other   . Hypertension Other     No Known Allergies  Medication list has been reviewed and updated.  Current Outpatient Medications on File Prior to Visit  Medication Sig Dispense Refill  . Adapalene 0.3 % gel     . ampicillin (PRINCIPEN) 500 MG capsule      No current facility-administered medications on file prior to visit.     Review of Systems:  As per HPI- otherwise negative. She is having 1-2 stools a  day, and they are a bit loose She tends more towards consipation  Physical Examination: Vitals:   08/25/18 1316 08/25/18 1331  BP: (!) 138/92 110/80  Pulse: (!) 56   Resp: 16   Temp: 97.6 F (36.4 C)   SpO2: 99%    Vitals:   08/25/18 1316  Weight: 188 lb (85.3 kg)  Height: 5\' 4"  (1.626 m)   Body mass index is 32.27 kg/m. Ideal Body Weight: Weight in (lb) to have BMI = 25: 145.3  GEN: WDWN, NAD, Non-toxic, A & O x 3, overweight, looks well  HEENT: Atraumatic, Normocephalic. Neck supple. No masses, No LAD.  TM wnl  Ears and Nose: No external deformity. CV: RRR, No M/G/R. No JVD. No thrill. No extra heart sounds. PULM: CTA B, no wheezes, crackles, rhonchi. No retractions. No resp.  distress. No accessory muscle use. ABD: S, NT, ND, +BS. No rebound. No HSM. EXTR: No c/c/e NEURO Normal gait.  PSYCH: Normally interactive. Conversant. Not depressed or anxious appearing.  Calm demeanor.   BP Readings from Last 3 Encounters:  08/25/18 110/80  05/06/17 138/84  06/19/16 112/82   Pulse Readings from Last 3 Encounters:  08/25/18 (!) 56  05/06/17 87  06/19/16 62   Pt notes that her pulse is often a bit low  No dizziness or CP  Assessment and Plan:   ICD-10-CM   1. Physical exam  Z00.00   2. Pre-diabetes  R73.03 Hemoglobin A1c  3. Solitary kidney  Q60.0 CBC    Comprehensive metabolic panel  4. Screening for hyperlipidemia  Z13.220 Lipid panel  5. Factor 5 Leiden mutation, heterozygous (HCC)  D68.51 CBC  6. Immunization due  Z23 Varicella-zoster vaccine IM (Shingrix)   Here today for a CPE Labs pending as above shingrix #1 today- she will schedule a nurse visit or her next doses  Follow up on pre-diabetes today Continue exercise    Follow-up: No follow-ups on file.  No orders of the defined types were placed in this encounter.  Orders Placed This Encounter  Procedures  . Varicella-zoster vaccine IM (Shingrix)  . CBC  . Comprehensive metabolic panel  . Hemoglobin A1c  . Lipid panel    @SIGN @    Signed Abbe AmsterdamJessica Tylyn Derwin, MD  Received her labs, message to patient  Results for orders placed or performed in visit on 08/25/18  CBC  Result Value Ref Range   WBC 5.3 4.0 - 10.5 K/uL   RBC 4.50 3.87 - 5.11 Mil/uL   Platelets 242.0 150.0 - 400.0 K/uL   Hemoglobin 14.4 12.0 - 15.0 g/dL   HCT 95.244.0 84.136.0 - 32.446.0 %   MCV 97.8 78.0 - 100.0 fl   MCHC 32.7 30.0 - 36.0 g/dL   RDW 40.112.8 02.711.5 - 25.315.5 %  Comprehensive metabolic panel  Result Value Ref Range   Sodium 134 (L) 135 - 145 mEq/L   Potassium 4.3 3.5 - 5.1 mEq/L   Chloride 101 96 - 112 mEq/L   CO2 27 19 - 32 mEq/L   Glucose, Bld 92 70 - 99 mg/dL   BUN 21 6 - 23 mg/dL   Creatinine, Ser 6.640.92 0.40 -  1.20 mg/dL   Total Bilirubin 0.5 0.2 - 1.2 mg/dL   Alkaline Phosphatase 67 39 - 117 U/L   AST 23 0 - 37 U/L   ALT 23 0 - 35 U/L   Total Protein 7.6 6.0 - 8.3 g/dL   Albumin 4.6 3.5 - 5.2 g/dL   Calcium 9.9 8.4 -  10.5 mg/dL   GFR 63.13 >60.00 mL/min  Hemoglobin A1c  Result Value Ref Range   Hgb A1c MFr Bld 6.0 4.6 - 6.5 %  Lipid panel  Result Value Ref Range   Cholesterol 222 (H) 0 - 200 mg/dL   Triglycerides 115.0 0.0 - 149.0 mg/dL   HDL 67.70 >39.00 mg/dL   VLDL 23.0 0.0 - 40.0 mg/dL   LDL Cholesterol 131 (H) 0 - 99 mg/dL   Total CHOL/HDL Ratio 3    NonHDL 154.16

## 2018-08-25 ENCOUNTER — Other Ambulatory Visit: Payer: Self-pay

## 2018-08-25 ENCOUNTER — Encounter: Payer: Self-pay | Admitting: Family Medicine

## 2018-08-25 ENCOUNTER — Ambulatory Visit (INDEPENDENT_AMBULATORY_CARE_PROVIDER_SITE_OTHER): Payer: BC Managed Care – PPO | Admitting: Family Medicine

## 2018-08-25 VITALS — BP 110/80 | HR 56 | Temp 97.6°F | Resp 16 | Ht 64.0 in | Wt 188.0 lb

## 2018-08-25 DIAGNOSIS — R7303 Prediabetes: Secondary | ICD-10-CM | POA: Diagnosis not present

## 2018-08-25 DIAGNOSIS — Z13220 Encounter for screening for lipoid disorders: Secondary | ICD-10-CM | POA: Diagnosis not present

## 2018-08-25 DIAGNOSIS — Q600 Renal agenesis, unilateral: Secondary | ICD-10-CM

## 2018-08-25 DIAGNOSIS — D6851 Activated protein C resistance: Secondary | ICD-10-CM

## 2018-08-25 DIAGNOSIS — Z0000 Encounter for general adult medical examination without abnormal findings: Secondary | ICD-10-CM

## 2018-08-25 DIAGNOSIS — Z23 Encounter for immunization: Secondary | ICD-10-CM

## 2018-08-25 DIAGNOSIS — IMO0002 Reserved for concepts with insufficient information to code with codable children: Secondary | ICD-10-CM

## 2018-08-25 LAB — COMPREHENSIVE METABOLIC PANEL
ALT: 23 U/L (ref 0–35)
AST: 23 U/L (ref 0–37)
Albumin: 4.6 g/dL (ref 3.5–5.2)
Alkaline Phosphatase: 67 U/L (ref 39–117)
BUN: 21 mg/dL (ref 6–23)
CO2: 27 mEq/L (ref 19–32)
Calcium: 9.9 mg/dL (ref 8.4–10.5)
Chloride: 101 mEq/L (ref 96–112)
Creatinine, Ser: 0.92 mg/dL (ref 0.40–1.20)
GFR: 63.13 mL/min (ref >60.00)
Glucose, Bld: 92 mg/dL (ref 70–99)
Potassium: 4.3 mEq/L (ref 3.5–5.1)
Sodium: 134 mEq/L — ABNORMAL LOW (ref 135–145)
Total Bilirubin: 0.5 mg/dL (ref 0.2–1.2)
Total Protein: 7.6 g/dL (ref 6.0–8.3)

## 2018-08-25 LAB — CBC
HCT: 44 % (ref 36.0–46.0)
Hemoglobin: 14.4 g/dL (ref 12.0–15.0)
MCHC: 32.7 g/dL (ref 30.0–36.0)
MCV: 97.8 fl (ref 78.0–100.0)
Platelets: 242 10*3/uL (ref 150.0–400.0)
RBC: 4.5 Mil/uL (ref 3.87–5.11)
RDW: 12.8 % (ref 11.5–15.5)
WBC: 5.3 10*3/uL (ref 4.0–10.5)

## 2018-08-25 LAB — LIPID PANEL
Cholesterol: 222 mg/dL — ABNORMAL HIGH (ref 0–200)
HDL: 67.7 mg/dL (ref >39.00)
LDL Cholesterol: 131 mg/dL — ABNORMAL HIGH (ref 0–99)
NonHDL: 154.16
Total CHOL/HDL Ratio: 3
Triglycerides: 115 mg/dL (ref 0.0–149.0)
VLDL: 23 mg/dL (ref 0.0–40.0)

## 2018-08-25 LAB — HEMOGLOBIN A1C: Hgb A1c MFr Bld: 6 % (ref 4.6–6.5)

## 2018-08-25 NOTE — Patient Instructions (Addendum)
We started your shingles vaccine series today - 2nd dose due in 2-6 months I will be in touch with your labs asap Purdy job with exercise!  Keep up the good work   Health Maintenance, Female Adopting a healthy lifestyle and getting preventive care are important in promoting health and wellness. Ask your health care provider about:  The right schedule for you to have regular tests and exams.  Things you can do on your own to prevent diseases and keep yourself healthy. What should I know about diet, weight, and exercise? Eat a healthy diet   Eat a diet that includes plenty of vegetables, fruits, low-fat dairy products, and lean protein.  Do not eat a lot of foods that are high in solid fats, added sugars, or sodium. Maintain a healthy weight Body mass index (BMI) is used to identify weight problems. It estimates body fat based on height and weight. Your health care provider can help determine your BMI and help you achieve or maintain a healthy weight. Get regular exercise Get regular exercise. This is one of the most important things you can do for your health. Most adults should:  Exercise for at least 150 minutes each week. The exercise should increase your heart rate and make you sweat (moderate-intensity exercise).  Do strengthening exercises at least twice a week. This is in addition to the moderate-intensity exercise.  Spend less time sitting. Even light physical activity can be beneficial. Watch cholesterol and blood lipids Have your blood tested for lipids and cholesterol at 56 years of age, then have this test every 5 years. Have your cholesterol levels checked more often if:  Your lipid or cholesterol levels are high.  You are older than 56 years of age.  You are at high risk for heart disease. What should I know about cancer screening? Depending on your health history and family history, you may need to have cancer screening at various ages. This may include screening  for:  Breast cancer.  Cervical cancer.  Colorectal cancer.  Skin cancer.  Lung cancer. What should I know about heart disease, diabetes, and high blood pressure? Blood pressure and heart disease  High blood pressure causes heart disease and increases the risk of stroke. This is more likely to develop in people who have high blood pressure readings, are of African descent, or are overweight.  Have your blood pressure checked: ? Every 3-5 years if you are 62-91 years of age. ? Every year if you are 36 years old or older. Diabetes Have regular diabetes screenings. This checks your fasting blood sugar level. Have the screening done:  Once every three years after age 51 if you are at a normal weight and have a low risk for diabetes.  More often and at a younger age if you are overweight or have a high risk for diabetes. What should I know about preventing infection? Hepatitis B If you have a higher risk for hepatitis B, you should be screened for this virus. Talk with your health care provider to find out if you are at risk for hepatitis B infection. Hepatitis C Testing is recommended for:  Everyone born from [birth year] through 1965.  Anyone with known risk factors for hepatitis C. Sexually transmitted infections (STIs)  Get screened for STIs, including gonorrhea and chlamydia, if: ? You are sexually active and are younger than 56 years of age. ? You are older than 56 years of age and your health care provider tells you that you  are at risk for this type of infection. ? Your sexual activity has changed since you were last screened, and you are at increased risk for chlamydia or gonorrhea. Ask your health care provider if you are at risk.  Ask your health care provider about whether you are at high risk for HIV. Your health care provider may recommend a prescription medicine to help prevent HIV infection. If you choose to take medicine to prevent HIV, you should first get tested for HIV.  You should then be tested every 3 months for as long as you are taking the medicine. Pregnancy  If you are about to stop having your period (premenopausal) and you may become pregnant, seek counseling before you get pregnant.  Take 400 to 800 micrograms (mcg) of folic acid every day if you become pregnant.  Ask for birth control (contraception) if you want to prevent pregnancy. Osteoporosis and menopause Osteoporosis is a disease in which the bones lose minerals and strength with aging. This can result in bone fractures. If you are 239 years old or older, or if you are at risk for osteoporosis and fractures, ask your health care provider if you should:  Be screened for bone loss.  Take a calcium or vitamin D supplement to lower your risk of fractures.  Be given hormone replacement therapy (HRT) to treat symptoms of menopause. Follow these instructions at home: Lifestyle  Do not use any products that contain nicotine or tobacco, such as cigarettes, e-cigarettes, and chewing tobacco. If you need help quitting, ask your health care provider.  Do not use street drugs.  Do not share needles.  Ask your health care provider for help if you need support or information about quitting drugs. Alcohol use  Do not drink alcohol if: ? Your health care provider tells you not to drink. ? You are pregnant, may be pregnant, or are planning to become pregnant.  If you drink alcohol: ? Limit how much you use to 0-1 drink a day. ? Limit intake if you are breastfeeding.  Be aware of how much alcohol is in your drink. In the [place] one drink equals one 12 oz bottle of beer (355 mL), one 5 oz glass of wine (148 mL), or one 1 oz glass of hard liquor (44 mL). General instructions  Schedule regular health, dental, and eye exams.  Stay current with your vaccines.  Tell your health care provider if: ? You often feel depressed. ? You have ever been abused or do not feel safe at  home. Summary  Adopting a healthy lifestyle and getting preventive care are important in promoting health and wellness.  Follow your health care provider's instructions about healthy diet, exercising, and getting tested or screened for diseases.  Follow your health care provider's instructions on monitoring your cholesterol and blood pressure. This information is not intended to replace advice given to you by your health care provider. Make sure you discuss any questions you have with your health care provider. Document Released: 07/29/2010 Document Revised: 01/06/2018 Document Reviewed: 01/06/2018 Elsevier Patient Education  2020 [employer]

## 2018-10-05 ENCOUNTER — Ambulatory Visit: Payer: BC Managed Care – PPO

## 2018-10-11 ENCOUNTER — Other Ambulatory Visit: Payer: Self-pay

## 2018-10-11 ENCOUNTER — Ambulatory Visit
Admission: RE | Admit: 2018-10-11 | Discharge: 2018-10-11 | Disposition: A | Discharge status: Home / Self Care | Payer: BC Managed Care – PPO | Source: Ambulatory Visit | Attending: Obstetrics & Gynecology | Admitting: Obstetrics & Gynecology

## 2018-10-11 DIAGNOSIS — Z1231 Encounter for screening mammogram for malignant neoplasm of breast: Secondary | ICD-10-CM

## 2018-11-18 ENCOUNTER — Other Ambulatory Visit: Payer: Self-pay

## 2018-11-18 ENCOUNTER — Ambulatory Visit (INDEPENDENT_AMBULATORY_CARE_PROVIDER_SITE_OTHER): Payer: BC Managed Care – PPO | Admitting: *Deleted

## 2018-11-18 DIAGNOSIS — Z23 Encounter for immunization: Secondary | ICD-10-CM | POA: Diagnosis not present

## 2018-11-18 NOTE — Progress Notes (Signed)
Patient here for shingles vaccine.  Vaccine given in right deltoid and patient tolerated well.

## 2019-09-05 ENCOUNTER — Encounter: Payer: BC Managed Care – PPO | Admitting: Family Medicine

## 2019-09-06 ENCOUNTER — Telehealth: Payer: Self-pay

## 2019-09-06 NOTE — Telephone Encounter (Signed)
Spoke with patient regarding symptoms.  Patient reports ankle/foot is much better, does not request appointment at this time.    [facility] - Client Nonclinical Telephone Record  [facility] - Client Client Site [facility] - Night Physician Copland, Shanda Bumps- MD Contact Type Call Who Is Calling Patient / Member / Family / Caregiver Caller Name Rozlyn Yerby Caller Phone Number [phone] Patient Name Holly Keller Patient DOB Jul 28, 1960 Call Type Message Only Information Provided Reason for Call Request to Schedule Office Appointment Initial Comment Caller reports her foot rolled last night and heard it crack a few times. Caller states it is black and blue, but she can walk on it. Additional Comment Provided caller with office hours and advised to call back during business hours. Caller declined triage. Disp. Time Disposition Final User 09/05/2019 8:01:17 AM General Information Provided Yes Pense, Lori Call Closed By: Russ Halo Transaction Date/Time: 09/05/2019 7:58:57 AM (ET

## 2019-09-10 NOTE — Patient Instructions (Signed)
It was great to see you again today, I will be in touch with your labs as soon as possible   Health Maintenance, Female Adopting a healthy lifestyle and getting preventive care are important in promoting health and wellness. Ask your health care provider about:  The right schedule for you to have regular tests and exams.  Things you can do on your own to prevent diseases and keep yourself healthy. What should I know about diet, weight, and exercise? Eat a healthy diet   Eat a diet that includes plenty of vegetables, fruits, low-fat dairy products, and lean protein.  Do not eat a lot of foods that are high in solid fats, added sugars, or sodium. Maintain a healthy weight Body mass index (BMI) is used to identify weight problems. It estimates body fat based on height and weight. Your health care provider can help determine your BMI and help you achieve or maintain a healthy weight. Get regular exercise Get regular exercise. This is one of the most important things you can do for your health. Most adults should:  Exercise for at least 150 minutes each week. The exercise should increase your heart rate and make you sweat (moderate-intensity exercise).  Do strengthening exercises at least twice a week. This is in addition to the moderate-intensity exercise.  Spend less time sitting. Even light physical activity can be beneficial. Watch cholesterol and blood lipids Have your blood tested for lipids and cholesterol at 57 years of age, then have this test every 5 years. Have your cholesterol levels checked more often if:  Your lipid or cholesterol levels are high.  You are older than 57 years of age.  You are at high risk for heart disease. What should I know about cancer screening? Depending on your health history and family history, you may need to have cancer screening at various ages. This may include screening for:  Breast cancer.  Cervical cancer.  Colorectal cancer.  Skin  cancer.  Lung cancer. What should I know about heart disease, diabetes, and high blood pressure? Blood pressure and heart disease  High blood pressure causes heart disease and increases the risk of stroke. This is more likely to develop in people who have high blood pressure readings, are of African descent, or are overweight.  Have your blood pressure checked: ? Every 3-5 years if you are 41-65 years of age. ? Every year if you are 24 years old or older. Diabetes Have regular diabetes screenings. This checks your fasting blood sugar level. Have the screening done:  Once every three years after age 19 if you are at a normal weight and have a low risk for diabetes.  More often and at a younger age if you are overweight or have a high risk for diabetes. What should I know about preventing infection? Hepatitis B If you have a higher risk for hepatitis B, you should be screened for this virus. Talk with your health care provider to find out if you are at risk for hepatitis B infection. Hepatitis C Testing is recommended for:  Everyone born from [birth year] through 1965.  Anyone with known risk factors for hepatitis C. Sexually transmitted infections (STIs)  Get screened for STIs, including gonorrhea and chlamydia, if: ? You are sexually active and are younger than 57 years of age. ? You are older than 57 years of age and your health care provider tells you that you are at risk for this type of infection. ? Your sexual activity has  changed since you were last screened, and you are at increased risk for chlamydia or gonorrhea. Ask your health care provider if you are at risk.  Ask your health care provider about whether you are at high risk for HIV. Your health care provider may recommend a prescription medicine to help prevent HIV infection. If you choose to take medicine to prevent HIV, you should first get tested for HIV. You should then be tested every 3 months for as long as you are taking  the medicine. Pregnancy  If you are about to stop having your period (premenopausal) and you may become pregnant, seek counseling before you get pregnant.  Take 400 to 800 micrograms (mcg) of folic acid every day if you become pregnant.  Ask for birth control (contraception) if you want to prevent pregnancy. Osteoporosis and menopause Osteoporosis is a disease in which the bones lose minerals and strength with aging. This can result in bone fractures. If you are 43 years old or older, or if you are at risk for osteoporosis and fractures, ask your health care provider if you should:  Be screened for bone loss.  Take a calcium or vitamin D supplement to lower your risk of fractures.  Be given hormone replacement therapy (HRT) to treat symptoms of menopause. Follow these instructions at home: Lifestyle  Do not use any products that contain nicotine or tobacco, such as cigarettes, e-cigarettes, and chewing tobacco. If you need help quitting, ask your health care provider.  Do not use street drugs.  Do not share needles.  Ask your health care provider for help if you need support or information about quitting drugs. Alcohol use  Do not drink alcohol if: ? Your health care provider tells you not to drink. ? You are pregnant, may be pregnant, or are planning to become pregnant.  If you drink alcohol: ? Limit how much you use to 0-1 drink a day. ? Limit intake if you are breastfeeding.  Be aware of how much alcohol is in your drink. In the [place] one drink equals one 12 oz bottle of beer (355 mL), one 5 oz glass of wine (148 mL), or one 1 oz glass of hard liquor (44 mL). General instructions  Schedule regular health, dental, and eye exams.  Stay current with your vaccines.  Tell your health care provider if: ? You often feel depressed. ? You have ever been abused or do not feel safe at home. Summary  Adopting a healthy lifestyle and getting preventive care are important in  promoting health and wellness.  Follow your health care provider's instructions about healthy diet, exercising, and getting tested or screened for diseases.  Follow your health care provider's instructions on monitoring your cholesterol and blood pressure. This information is not intended to replace advice given to you by your health care provider. Make sure you discuss any questions you have with your health care provider. Document Revised: 01/06/2018 Document Reviewed: 01/06/2018 Elsevier Patient Education  2020 [employer].

## 2019-09-10 NOTE — Progress Notes (Signed)
[facility] [address] [place], [place] [address] [phone]  Date:  09/12/2019   Name:  Holly Keller   DOB:  November 17, 1960   MRN:  [id number]  PCP:  Pearline Cables, MD    Chief Complaint: Annual Exam (kidney function lab work)   History of Present Illness:  Holly Keller is a 59 y.o. very pleasant female patient who presents with the following:  Here today for routine follow-up visit/physical exam-last seen by myself for physical 1 year ago History of factor V Leiden mutation, solitary kidney secondary to nephrectomy for suspected cancer 2017-kidney turned out to be benign Also history of osteomyelitis in her clavicle in 2011, prediabetes No urinary changes  She saw her gynecologist, Dr. Langston Masker in the fall  COVID-19 vaccine done  Pap; per GYN  HIV screening-take care of today Colon cancer up-to-date Mammo up-to-date- she will schedule soon  Labs today- she is fasting for labs today  Married to [employer] smoker- she smoked 4-5 years total  Her son is starting college this week- he is going to [employer]  She did twist her ankle again not long ago; this disrupted  her exercise routine, but it is getting better  Patient Active Problem List   Diagnosis Date Noted  . Solitary kidney, acquired 04/14/2016  . Microhematuria 04/14/2016  . Renal mass, right 01/16/2016  . METHICILLIN SUSCEPTIBLE STAPH AUREUS SEPTICEMIA 09/26/2009  . SEPTIC ARTHRITIS 09/26/2009  . ACUTE OSTEOMYELITIS OTHER SPECIFIED SITE 09/26/2009  . UNSPECIFIED DISEASE OF SEBACEOUS GLANDS 05/22/2008  . Factor 5 Leiden mutation, heterozygous (HCC) 12/27/2007  . 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 ANKLE SPRAIN, RIGHT 12/27/2007    Past Medical History:  Diagnosis Date  . Heart murmur   . Recent URI    denies any fever, clear drainage from head  . Right renal mass     Past Surgical History:  Procedure Laterality Date  . FOOT SURGERY  Bilateral    bunionectomy  . FRACTURE SURGERY Left    middle finger  . LAPAROSCOPIC NEPHRECTOMY Right 01/16/2016   Procedure: LAPAROSCOPIC RADICAL NEPHRECTOMY;  Surgeon: Crist Fat, MD;  Location: [facility];  Service: Urology;  Laterality: Right;  . OTHER SURGICAL HISTORY Right    I&D right clavicle    Social History   Tobacco Use  . Smoking status: Former Smoker    Packs/day: 0.25    Types: Cigarettes    Quit date: 01/27/2013    Years since quitting: 6.6  . Smokeless tobacco: Never Used  Substance Use Topics  . Alcohol use: Yes    Alcohol/week: 7.0 standard drinks    Types: 7 Glasses of wine per week  . Drug use: No    Family History  Problem Relation Age of Onset  . Alcoholism Other   . Arthritis Other   . Elevated Lipids Other   . Hypertension Other     No Known Allergies  Medication list has been reviewed and updated.  No current outpatient medications on file prior to visit.   No current facility-administered medications on file prior to visit.    Review of Systems:  As per HPI- otherwise negative.   Physical Examination: Vitals:   09/12/19 0855  BP: 112/72  Pulse: 71  Resp: 16  SpO2: 98%   Vitals:   09/12/19 0855  Weight: 186 lb (84.4 kg)  Height: 5\' 4"  (1.626 m)   Body mass index  is 31.93 kg/m. Ideal Body Weight: Weight in (lb) to have BMI = 25: 145.3  GEN: no acute distress.  Overweight, looks well HEENT: Atraumatic, Normocephalic.   Bilateral TM wnl, oropharynx normal.  PEERL,EOMI.   Ears and Nose: No external deformity. CV: RRR, No M/G/R. No JVD. No thrill. No extra heart sounds. PULM: CTA B, no wheezes, crackles, rhonchi. No retractions. No resp. distress. No accessory muscle use. ABD: S, NT, ND, +BS. No rebound. No HSM. EXTR: No c/c/e PSYCH: Normally interactive. Conversant.    Assessment and Plan: Physical exam  Pre-diabetes - Plan: Comprehensive metabolic panel, Hemoglobin A1c  Solitary kidney - Plan: Comprehensive  metabolic panel  Screening for hyperlipidemia - Plan: Lipid panel  Factor 5 Leiden mutation, heterozygous (HCC) - Plan: CBC  Screening for HIV without presence of risk factors - Plan: HIV Antibody (routine testing w rflx)  Screening for thyroid disorder - Plan: TSH Patient today for physical Routine labs pending as above Discussed health maintenance Encouraged continued healthy diet and exercise Will plan further follow- up pending labs.   This visit occurred during the SARS-CoV-2 public health emergency.  Safety protocols were in place, including screening questions prior to the visit, additional usage of staff PPE, and extensive cleaning of exam room while observing appropriate contact time as indicated for disinfecting solutions.    Signed Abbe Amsterdam, MD  Received patient's labs as below, message to patient    Results for orders placed or performed in visit on 09/12/19  CBC  Result Value Ref Range   WBC 5.5 4.0 - 10.5 K/uL   RBC 4.35 3.87 - 5.11 Mil/uL   Platelets 197.0 150 - 400 K/uL   Hemoglobin 14.3 12.0 - 15.0 g/dL   HCT 95.6 36 - 46 %   MCV 97.3 78.0 - 100.0 fl   MCHC 33.8 30.0 - 36.0 g/dL   RDW 38.7 56.4 - 33.2 %  Comprehensive metabolic panel  Result Value Ref Range   Sodium 138 135 - 145 mEq/L   Potassium 4.5 3.5 - 5.1 mEq/L   Chloride 106 96 - 112 mEq/L   CO2 25 19 - 32 mEq/L   Glucose, Bld 108 (H) 70 - 99 mg/dL   BUN 27 (H) 6 - 23 mg/dL   Creatinine, Ser 9.51 0.40 - 1.20 mg/dL   Total Bilirubin 0.4 0.2 - 1.2 mg/dL   Alkaline Phosphatase 69 39 - 117 U/L   AST 14 0 - 37 U/L   ALT 17 0 - 35 U/L   Total Protein 7.0 6.0 - 8.3 g/dL   Albumin 4.2 3.5 - 5.2 g/dL   GFR 88.41 (L) >66.06 mL/min   Calcium 9.5 8.4 - 10.5 mg/dL  Hemoglobin T0Z  Result Value Ref Range   Hgb A1c MFr Bld 5.7 4.6 - 6.5 %  Lipid panel  Result Value Ref Range   Cholesterol 191 0 - 200 mg/dL   Triglycerides 601.0 0 - 149 mg/dL   HDL 93.23 >55.73 mg/dL   VLDL 22.0 0.0 - 25.4  mg/dL   LDL Cholesterol 270 (H) 0 - 99 mg/dL   Total CHOL/HDL Ratio 3    NonHDL 120.55   TSH  Result Value Ref Range   TSH 4.38 0.35 - 4.50 uIU/mL    Blood counts are normal Metabolic profile looks okay-your BUN was just slightly high, GFR (kidney filtration rate) slightly low.  These abnormalities may indicate that you are a bit dehydrated, I know that you were fasting.  I would suggest  rechecking in 6 months  A1c is improved from previous, still in prediabetes range Cholesterol looks good Thyroid in normal range  Please schedule to see me in about 6 months, we can recheck your kidney function at that time   The 10-year ASCVD risk score Denman George DC Montez Hageman., et al., 2013) is: 1.4%   Values used to calculate the score:     Age: 76 years     Sex: Female     Is Non-Hispanic African American: No     Diabetic: No     Tobacco smoker: No     Systolic Blood Pressure: 112 mmHg     Is BP treated: No     HDL Cholesterol: 70.9 mg/dL     Total Cholesterol: 191 mg/dL

## 2019-09-12 ENCOUNTER — Other Ambulatory Visit: Payer: Self-pay

## 2019-09-12 ENCOUNTER — Encounter: Payer: Self-pay | Admitting: Family Medicine

## 2019-09-12 ENCOUNTER — Ambulatory Visit (INDEPENDENT_AMBULATORY_CARE_PROVIDER_SITE_OTHER): Payer: BC Managed Care – PPO | Admitting: Family Medicine

## 2019-09-12 VITALS — BP 112/72 | HR 71 | Resp 16 | Ht 64.0 in | Wt 186.0 lb

## 2019-09-12 DIAGNOSIS — Q600 Renal agenesis, unilateral: Secondary | ICD-10-CM

## 2019-09-12 DIAGNOSIS — Z0000 Encounter for general adult medical examination without abnormal findings: Secondary | ICD-10-CM

## 2019-09-12 DIAGNOSIS — R7303 Prediabetes: Secondary | ICD-10-CM | POA: Diagnosis not present

## 2019-09-12 DIAGNOSIS — D6851 Activated protein C resistance: Secondary | ICD-10-CM

## 2019-09-12 DIAGNOSIS — Z13220 Encounter for screening for lipoid disorders: Secondary | ICD-10-CM

## 2019-09-12 DIAGNOSIS — Z114 Encounter for screening for human immunodeficiency virus [HIV]: Secondary | ICD-10-CM

## 2019-09-12 DIAGNOSIS — IMO0002 Reserved for concepts with insufficient information to code with codable children: Secondary | ICD-10-CM

## 2019-09-12 DIAGNOSIS — Z1329 Encounter for screening for other suspected endocrine disorder: Secondary | ICD-10-CM

## 2019-09-12 LAB — COMPREHENSIVE METABOLIC PANEL
ALT: 17 U/L (ref 0–35)
AST: 14 U/L (ref 0–37)
Albumin: 4.2 g/dL (ref 3.5–5.2)
Alkaline Phosphatase: 69 U/L (ref 39–117)
BUN: 27 mg/dL — ABNORMAL HIGH (ref 6–23)
CO2: 25 mEq/L (ref 19–32)
Calcium: 9.5 mg/dL (ref 8.4–10.5)
Chloride: 106 mEq/L (ref 96–112)
Creatinine, Ser: 0.99 mg/dL (ref 0.40–1.20)
GFR: 57.79 mL/min — ABNORMAL LOW (ref >60.00)
Glucose, Bld: 108 mg/dL — ABNORMAL HIGH (ref 70–99)
Potassium: 4.5 mEq/L (ref 3.5–5.1)
Sodium: 138 mEq/L (ref 135–145)
Total Bilirubin: 0.4 mg/dL (ref 0.2–1.2)
Total Protein: 7 g/dL (ref 6.0–8.3)

## 2019-09-12 LAB — CBC
HCT: 42.3 % (ref 36.0–46.0)
Hemoglobin: 14.3 g/dL (ref 12.0–15.0)
MCHC: 33.8 g/dL (ref 30.0–36.0)
MCV: 97.3 fl (ref 78.0–100.0)
Platelets: 197 10*3/uL (ref 150.0–400.0)
RBC: 4.35 Mil/uL (ref 3.87–5.11)
RDW: 12.8 % (ref 11.5–15.5)
WBC: 5.5 10*3/uL (ref 4.0–10.5)

## 2019-09-12 LAB — LIPID PANEL
Cholesterol: 191 mg/dL (ref 0–200)
HDL: 70.9 mg/dL (ref >39.00)
LDL Cholesterol: 101 mg/dL — ABNORMAL HIGH (ref 0–99)
NonHDL: 120.55
Total CHOL/HDL Ratio: 3
Triglycerides: 100 mg/dL (ref 0.0–149.0)
VLDL: 20 mg/dL (ref 0.0–40.0)

## 2019-09-12 LAB — HEMOGLOBIN A1C: Hgb A1c MFr Bld: 5.7 % (ref 4.6–6.5)

## 2019-09-12 LAB — TSH: TSH: 4.38 u[IU]/mL (ref 0.35–4.50)

## 2019-09-13 LAB — HIV ANTIBODY (ROUTINE TESTING W REFLEX): HIV 1&2 Ab, 4th Generation: NONREACTIVE

## 2019-09-22 ENCOUNTER — Telehealth: Payer: Self-pay | Admitting: Family Medicine

## 2019-09-22 NOTE — Telephone Encounter (Signed)
New Message:   Pt is calling and would like to know if Dr. Patsy Lager would put in a order in for a X-ray for her foot. She states she was in last week to see her and it isn't getting any better. Please advise.

## 2019-09-23 ENCOUNTER — Encounter: Payer: Self-pay | Admitting: Family Medicine

## 2019-10-25 ENCOUNTER — Other Ambulatory Visit: Payer: Self-pay | Admitting: Obstetrics & Gynecology

## 2019-10-25 DIAGNOSIS — Z1231 Encounter for screening mammogram for malignant neoplasm of breast: Secondary | ICD-10-CM

## 2019-10-28 ENCOUNTER — Ambulatory Visit
Admission: RE | Admit: 2019-10-28 | Discharge: 2019-10-28 | Disposition: A | Discharge status: Home / Self Care | Payer: BC Managed Care – PPO | Source: Ambulatory Visit | Attending: Obstetrics & Gynecology | Admitting: Obstetrics & Gynecology

## 2019-10-28 ENCOUNTER — Other Ambulatory Visit: Payer: Self-pay

## 2019-10-28 ENCOUNTER — Encounter: Payer: Self-pay | Admitting: Family Medicine

## 2019-10-28 DIAGNOSIS — Z1231 Encounter for screening mammogram for malignant neoplasm of breast: Secondary | ICD-10-CM

## 2020-07-24 ENCOUNTER — Telehealth (INDEPENDENT_AMBULATORY_CARE_PROVIDER_SITE_OTHER): Payer: BC Managed Care – PPO | Admitting: Family Medicine

## 2020-07-24 ENCOUNTER — Encounter: Payer: Self-pay | Admitting: Family Medicine

## 2020-07-24 ENCOUNTER — Other Ambulatory Visit: Payer: Self-pay

## 2020-07-24 DIAGNOSIS — M791 Myalgia, unspecified site: Secondary | ICD-10-CM

## 2020-07-24 DIAGNOSIS — R6889 Other general symptoms and signs: Secondary | ICD-10-CM | POA: Diagnosis not present

## 2020-07-24 DIAGNOSIS — R5383 Other fatigue: Secondary | ICD-10-CM

## 2020-07-24 MED ORDER — ONDANSETRON 4 MG PO TBDP: 4.0000 mg | ORAL_TABLET | Freq: Three times a day (TID) | ORAL | 0 refills | Status: DC | PRN

## 2020-07-24 MED ORDER — PREDNISONE 20 MG PO TABS: 40.0000 mg | ORAL_TABLET | Freq: Every day | ORAL | 0 refills | Status: AC

## 2020-07-24 NOTE — Progress Notes (Signed)
CC: URI s/s's  Holly Keller here for URI complaints. Due to COVID-19 pandemic, we are interacting via web portal for an electronic face-to-face visit. I verified patient's ID using 2 identifiers. Patient agreed to proceed with visit via this method. Patient is at home, I am at office. Patient and I are present for visit.   Duration: 9 days  Associated symptoms: myalgia and fatigue Denies: sinus congestion, sinus pain, rhinorrhea, itchy watery eyes, ear pain, ear drainage, sore throat, wheezing, shortness of breath, and fevers, N/V/D; had ST, diarrhea, coughing last week that has gotten better.  Treatment to date: Dayquil, Nyquil, Tylenol, cold and flu symptoms Sick contacts: No Covid testing neg x 3.  Vaccinated and boosted x 1.   Past Medical History:  Diagnosis Date   Heart murmur    Recent URI    denies any fever, clear drainage from head   Right renal mass    Objective No conversational dyspnea Age appropriate judgment and insight Nml affect and mood  Flu-like symptoms - Plan: predniSONE (DELTASONE) 20 MG tablet, ondansetron (ZOFRAN-ODT) 4 MG disintegrating tablet  It seems like she has COVID.  She got better and then worse has myalgias, history of fevers, GI symptoms in addition to respiratory symptoms.  Zofran as needed, 5-day prednisone burst to help with myalgias.  She has a solitary kidney so would prefer to avoid anti-inflammatories. Continue to push fluids, practice good hand hygiene, cover mouth when coughing. F/u prn. If starting to experience fevers, shaking, or shortness of breath, seek immediate care. Pt voiced understanding and agreement to the plan.  [name], DO 07/24/20 2:17 PM

## 2020-07-25 ENCOUNTER — Other Ambulatory Visit: Payer: BC Managed Care – PPO

## 2020-11-27 ENCOUNTER — Other Ambulatory Visit: Payer: Self-pay | Admitting: Obstetrics & Gynecology

## 2020-11-27 DIAGNOSIS — Z1231 Encounter for screening mammogram for malignant neoplasm of breast: Secondary | ICD-10-CM

## 2021-01-03 ENCOUNTER — Ambulatory Visit
Admission: RE | Admit: 2021-01-03 | Discharge: 2021-01-03 | Disposition: A | Discharge status: Home / Self Care | Payer: BC Managed Care – PPO | Source: Ambulatory Visit | Attending: Obstetrics & Gynecology | Admitting: Obstetrics & Gynecology

## 2021-01-03 ENCOUNTER — Other Ambulatory Visit: Payer: Self-pay

## 2021-01-03 DIAGNOSIS — Z1231 Encounter for screening mammogram for malignant neoplasm of breast: Secondary | ICD-10-CM

## 2021-06-23 IMAGING — MG MM DIGITAL SCREENING BILAT W/ CAD
5 series · 5 of 5 positions shown · non-contrast
Comparison: Previous exam(s).

CLINICAL DATA: Screening.

EXAM:
DIGITAL SCREENING BILATERAL MAMMOGRAM WITH CAD

[R CC (1 of 2)]
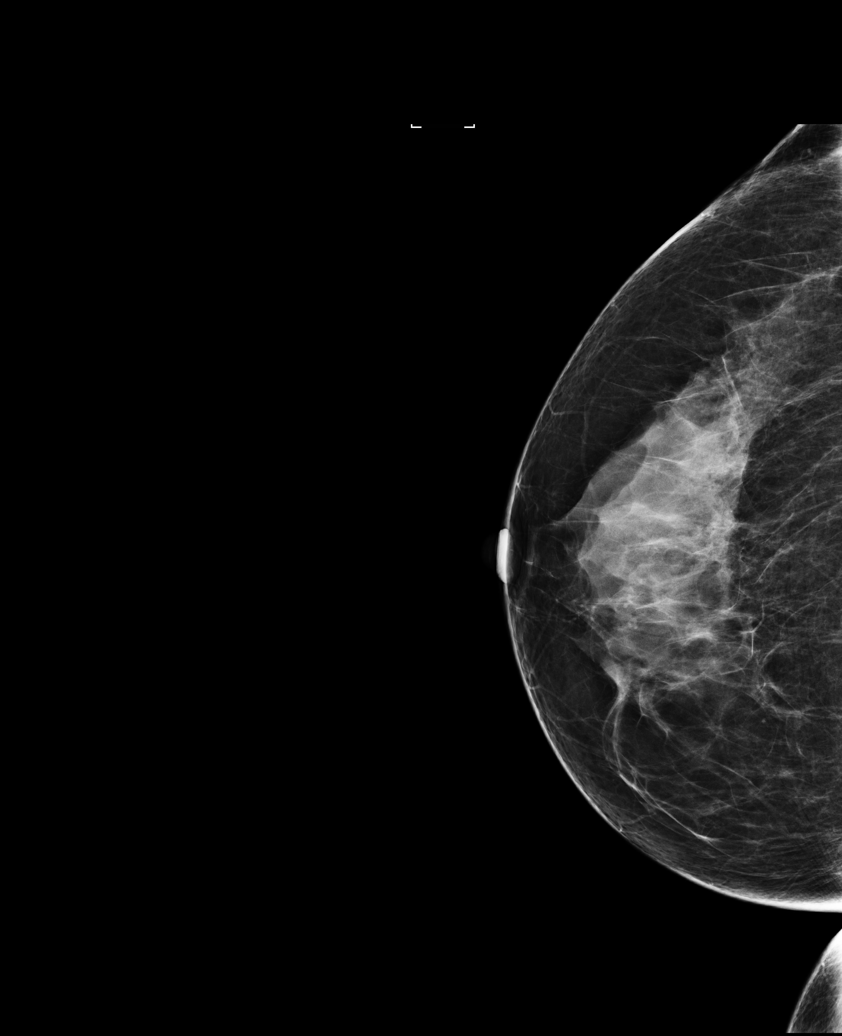

[R CC (2 of 2)]
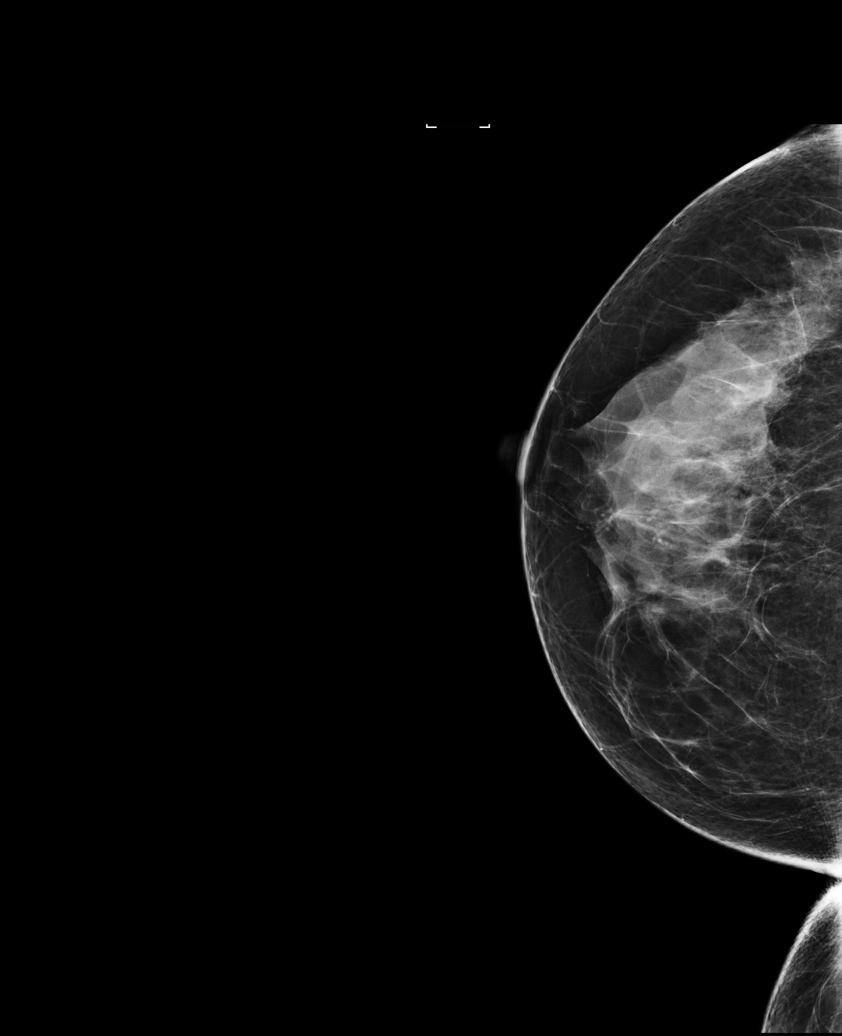

[L MLO]
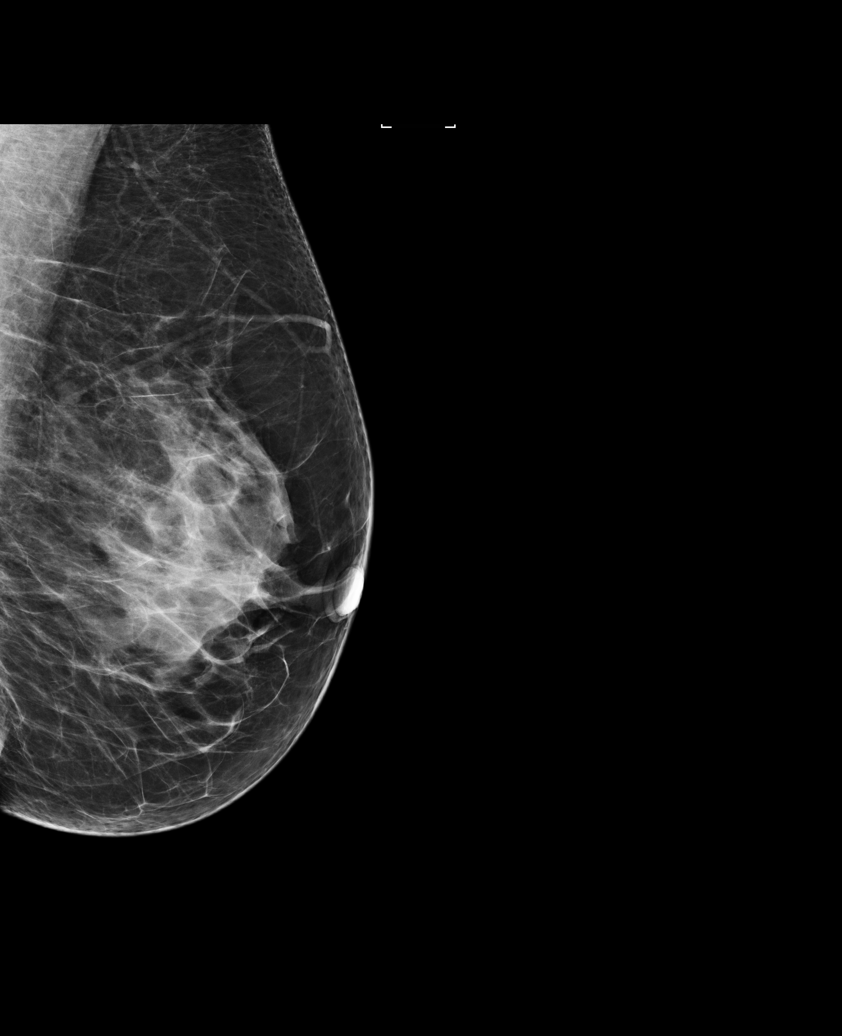

[R MLO]
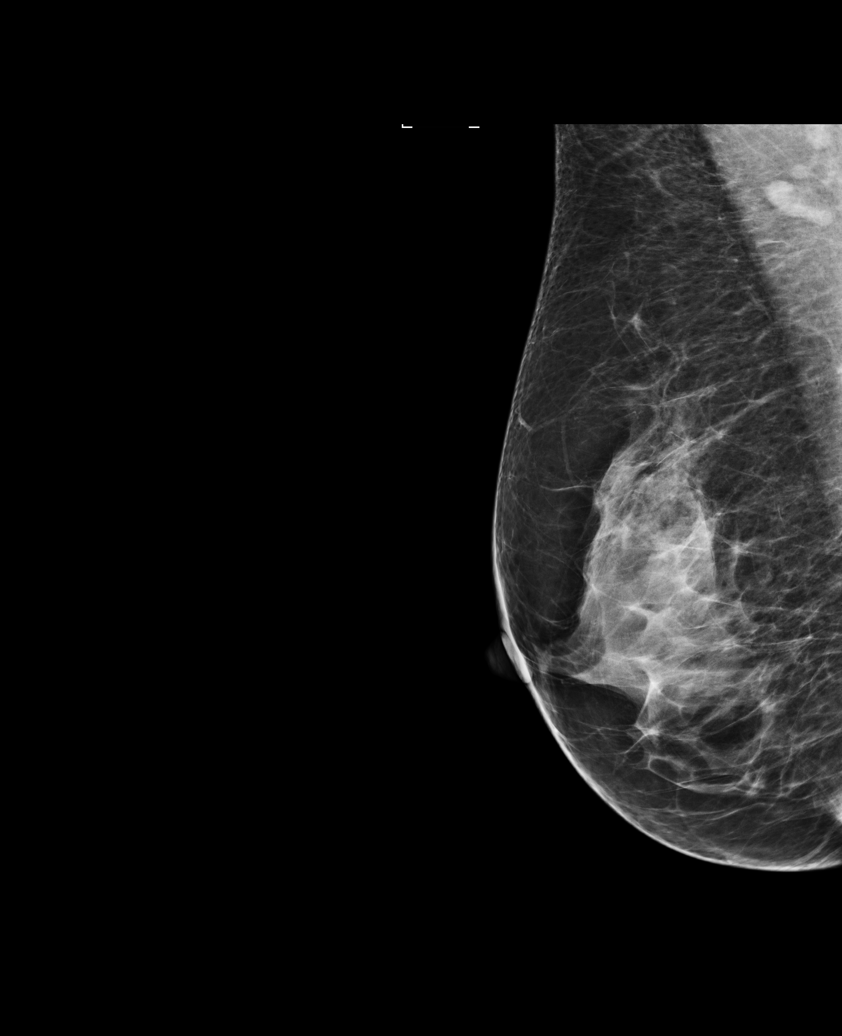

[L CC]
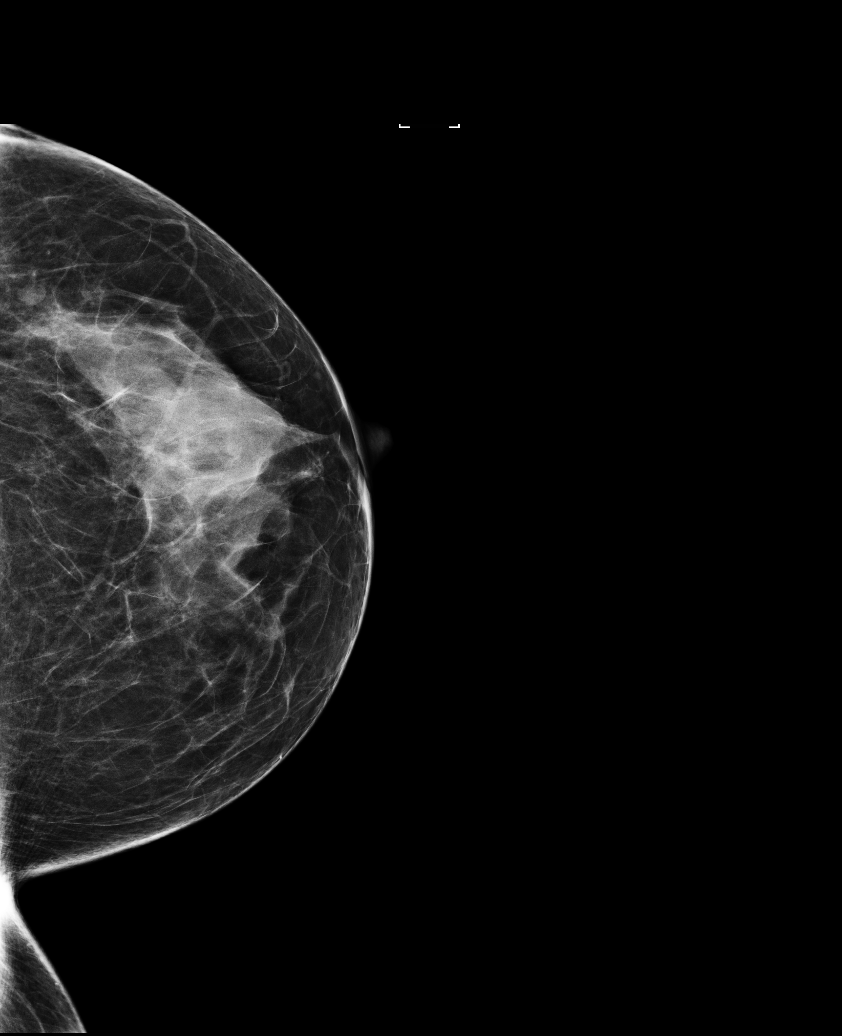

[5 of 5 positions shown; findings below may reference images not displayed]

ACR Breast Density Category c: The breast tissue is heterogeneously
dense, which may obscure small masses.
FINDINGS: There are no findings suspicious for malignancy. Images were
processed with CAD.
IMPRESSION: No mammographic evidence of malignancy. A result letter of this
screening mammogram will be mailed directly to the patient.

RECOMMENDATION:
Screening mammogram in one year. (Code:[id number])

BI-RADS CATEGORY  1: Negative.

## 2021-08-26 ENCOUNTER — Telehealth: Payer: Self-pay

## 2021-08-26 ENCOUNTER — Other Ambulatory Visit: Payer: Self-pay

## 2021-08-26 ENCOUNTER — Encounter (HOSPITAL_COMMUNITY): Payer: Self-pay | Admitting: Orthopedic Surgery

## 2021-08-26 NOTE — Pre-Procedure Instructions (Signed)
    Holly Keller  08/26/2021      [facility] [id number] - [name] - [address] [name] [address] Phone: [phone] Fax: [phone]    Your procedure is scheduled on 08/27/2021.  Report to [facility] Admitting at 1350 A.M.  Call this number if you have problems the morning of surgery:  [phone]   Remember:  Do not eat or drink after midnight.     Take these medicines the morning of surgery with A SIP OF WATER: Norco, Tylenol     Do not wear jewelry, make-up or nail polish.  Do not wear lotions, powders, or perfumes, or deodorant.  Do not shave 48 hours prior to surgery.  Men may shave face and neck.  Do not bring valuables to the hospital.  [facility] is not responsible for any belongings or valuables.  Contacts, dentures or bridgework may not be worn into surgery.  Leave your suitcase in the car.  After surgery it may be brought to your room.  For patients admitted to the hospital, discharge time will be determined by your treatment team.  Patients discharged the day of surgery will not be allowed to drive home.   Name and phone number of your driver:    [name] [phone] [name] [phone]

## 2021-08-26 NOTE — Telephone Encounter (Signed)
Nurse Assessment Nurse: Laurey Morale, RN, Lovena Le Date/Time (Eastern Time): 08/24/2021 10:13:09 AM Confirm and document reason for call. If symptomatic, describe symptoms. ---Caller states she thinks she has a broken wrist since last night after falling down 4 flights of stairs. She does not have any bruising. The hand is swollen and red. She is able to move her fingers. Does the patient have any new or worsening symptoms? ---Yes Will a triage be completed? ---Yes Related visit to physician within the last 2 weeks? ---No Does the PT have any chronic conditions? (i.e. diabetes, asthma, this includes High risk factors for pregnancy, etc.) ---No Is this a behavioral health or substance abuse call? ---No Guidelines Guideline Title Affirmed Question Affirmed Notes Nurse Date/Time (Eastern Time) Hand and Wrist Injury Looks like a dislocated joint (e.g., crooked or deformed) Dufrene, RN, Jemika 08/24/2021 10:16:52 AM Disp. Time Lamount Cohen Time) Disposition Final User 08/24/2021 10:22:11 AM Go to ED Now Yes Dufrene, RN, Lovena Le Final Disposition 08/24/2021 10:22:11 AM Go to ED Now Yes Dufrene, RN, Lovena Le PLEASE NOTE: All timestamps contained within this report are represented as [place] Standard Time. CONFIDENTIALTY NOTICE: This fax transmission is intended only for the addressee. It contains information that is legally privileged, confidential or otherwise protected from use or disclosure. If you are not the intended recipient, you are strictly prohibited from reviewing, disclosing, copying using or disseminating any of this information or taking any action in reliance on or regarding this information. If you have received this fax in error, please notify us immediately by telephone so that we can arrange for its return to [place]. Phone: [phone], Toll-Free: [phone], Fax: [phone] Page: 2 of 2 Call Id: [id number] Caller Disagree/Comply Comply Caller Understands Yes PreDisposition  InappropriateToAsk Care Advice Given Per Guideline GO TO ED NOW: * Leave now. Drive carefully. FIRST AID ADVICE FOR SUSPECTED HAND OR WRIST FRACTURE (BROKEN BONE) OR DISLOCATION (OUT OF JOINT): * Immobilize the hand and wrist by placing them on a rigid splint (e.g., small board, magazine folded in half, folded up newspaper). REMOVE RINGS: * If possible, remove any rings or jewelry from the fingers of the injured hand. NOTHING BY MOUTH: * Do not eat or drink anything for now. CARE ADVICE given per Hand and Wrist Injury (Adult) guideline. Referrals GO TO FACILITY UNDECIDED

## 2021-08-26 NOTE — Telephone Encounter (Signed)
Pt seen at after hours ortho urgent care at [facility] on 08/24/21.

## 2021-08-27 ENCOUNTER — Encounter (HOSPITAL_COMMUNITY)
Admission: RE | Disposition: A | Discharge status: Home / Self Care | Payer: Self-pay | Source: Home / Self Care | Attending: Orthopedic Surgery

## 2021-08-27 ENCOUNTER — Ambulatory Visit (HOSPITAL_COMMUNITY)
Admission: RE | Admit: 2021-08-27 | Discharge: 2021-08-27 | Disposition: A | Discharge status: Home / Self Care | Payer: BC Managed Care – PPO | Attending: Orthopedic Surgery | Admitting: Orthopedic Surgery

## 2021-08-27 HISTORY — DX: Chronic kidney disease, unspecified: N18.9

## 2021-08-27 SURGERY — OPEN REDUCTION INTERNAL FIXATION (ORIF) DISTAL RADIUS FRACTURE
Anesthesia: Choice | Laterality: Right

## 2021-09-22 NOTE — Progress Notes (Unsigned)
[facility] at [facility] [address], [address] [place], [place] [address] [phone]  Date:  09/23/2021   Name:  Holly Keller   DOB:  [birth year]   MRN:  [id number]  PCP:  Pearline Cables, MD    Chief Complaint: No chief complaint on file.   History of Present Illness:  Holly Keller is a 61 y.o. very pleasant female patient who presents with the following:  Patient seen today for physical exam Most recent visit with myself was about 2 years ago, August 2021 History of factor V Leiden mutation, solitary kidney secondary to nephrectomy for suspected cancer 2017-kidney turned out to be benign Also history of osteomyelitis in her clavicle in 2011, prediabetes  Former very light smoker Her son is a Consulting civil engineer at [employer] Married to [place]  She recently had a distal radius fracture and underwent surgery per Dr. Amanda Pea  Her gynecologist is Dr. Langston Masker, she was seen in January  Pap smear Recommend COVID-19 booster and flu shot Mammogram up-to-date Colonoscopy 2020 Update lab work today Patient Active Problem List   Diagnosis Date Noted   Solitary kidney, acquired 04/14/2016   Microhematuria 04/14/2016   Renal mass, right 01/16/2016   METHICILLIN SUSCEPTIBLE STAPH AUREUS SEPTICEMIA 09/26/2009   SEPTIC ARTHRITIS 09/26/2009   ACUTE OSTEOMYELITIS OTHER SPECIFIED SITE 09/26/2009   UNSPECIFIED DISEASE OF SEBACEOUS GLANDS 05/22/2008   Factor 5 Leiden mutation, heterozygous (HCC) 12/27/2007   ACNE VULGARIS, FACIAL 12/27/2007   ANKLE SPRAIN, RIGHT 12/27/2007    Past Medical History:  Diagnosis Date   Bradycardia 2017   HR down to the 40's   Chronic kidney disease    Factor V deficiency (HCC) 2001   Heart murmur    Recent URI    denies any fever, clear drainage from head   Right renal mass     Past Surgical History:  Procedure Laterality Date   FOOT SURGERY Bilateral    bunionectomy   FRACTURE SURGERY Left    middle  finger   LAPAROSCOPIC NEPHRECTOMY Right 01/16/2016   Procedure: LAPAROSCOPIC RADICAL NEPHRECTOMY;  Surgeon: Crist Fat, MD;  Location: [facility];  Service: Urology;  Laterality: Right;   OTHER SURGICAL HISTORY Right    I&D right clavicle    Social History   Tobacco Use   Smoking status: Former    Packs/day: 0.25    Types: Cigarettes    Quit date: 01/27/2013    Years since quitting: 8.6   Smokeless tobacco: Never  Substance Use Topics   Alcohol use: Yes    Alcohol/week: 21.0 standard drinks of alcohol    Types: 21 Glasses of wine per week   Drug use: No    Family History  Problem Relation Age of Onset   Alcoholism Other    Arthritis Other    Elevated Lipids Other    Hypertension Other     No Known Allergies  Medication list has been reviewed and updated.  Current Outpatient Medications on File Prior to Visit  Medication Sig Dispense Refill   acetaminophen (TYLENOL) 500 MG tablet Take 1,000 mg by mouth every 6 (six) hours as needed for moderate pain.     aspirin EC 81 MG tablet Take 81 mg by mouth daily. Swallow whole.     HYDROcodone-acetaminophen (NORCO/VICODIN) 5-325 MG tablet Take 1 tablet by mouth every 6 (six) hours as needed for moderate pain.     ondansetron (ZOFRAN-ODT) 4 MG disintegrating tablet  Take 1 tablet (4 mg total) by mouth every 8 (eight) hours as needed for nausea or vomiting. 20 tablet 0   No current facility-administered medications on file prior to visit.    Review of Systems:  As per HPI- otherwise negative.   Physical Examination: There were no vitals filed for this visit. There were no vitals filed for this visit. There is no height or weight on file to calculate BMI. Ideal Body Weight:    GEN: no acute distress. HEENT: Atraumatic, Normocephalic.  Ears and Nose: No external deformity. CV: RRR, No M/G/R. No JVD. No thrill. No extra heart sounds. PULM: CTA B, no wheezes, crackles, rhonchi. No retractions. No resp. distress. No  accessory muscle use. ABD: S, NT, ND, +BS. No rebound. No HSM. EXTR: No c/c/e PSYCH: Normally interactive. Conversant.    Assessment and Plan: *** Physical exam today.  Encouraged healthy diet and exercise routine Will plan further follow- up pending labs.  Signed Abbe Amsterdam, MD

## 2021-09-22 NOTE — Patient Instructions (Incomplete)
It was great to see you again today, I will be in touch with your blood work as soon as possible Recommend flu shot and COVID-19 booster this fall if needed Ordered your bone density to be done if you like- you can schedule this at your convenience  Take care!  Work on getting back into a regular exercise program

## 2021-09-23 ENCOUNTER — Ambulatory Visit (INDEPENDENT_AMBULATORY_CARE_PROVIDER_SITE_OTHER): Payer: BC Managed Care – PPO | Admitting: Family Medicine

## 2021-09-23 ENCOUNTER — Encounter: Payer: Self-pay | Admitting: Family Medicine

## 2021-09-23 VITALS — BP 112/62 | HR 60 | Temp 97.7°F | Resp 18 | Ht 64.0 in | Wt 184.2 lb

## 2021-09-23 DIAGNOSIS — Z131 Encounter for screening for diabetes mellitus: Secondary | ICD-10-CM | POA: Diagnosis not present

## 2021-09-23 DIAGNOSIS — Z13220 Encounter for screening for lipoid disorders: Secondary | ICD-10-CM | POA: Diagnosis not present

## 2021-09-23 DIAGNOSIS — E2839 Other primary ovarian failure: Secondary | ICD-10-CM

## 2021-09-23 DIAGNOSIS — Z1329 Encounter for screening for other suspected endocrine disorder: Secondary | ICD-10-CM

## 2021-09-23 DIAGNOSIS — Z905 Acquired absence of kidney: Secondary | ICD-10-CM

## 2021-09-23 DIAGNOSIS — D6851 Activated protein C resistance: Secondary | ICD-10-CM | POA: Diagnosis not present

## 2021-09-23 DIAGNOSIS — Z0000 Encounter for general adult medical examination without abnormal findings: Secondary | ICD-10-CM

## 2021-09-23 LAB — LIPID PANEL
Cholesterol: 197 mg/dL (ref 0–200)
HDL: 62.1 mg/dL (ref >39.00)
LDL Cholesterol: 113 mg/dL — ABNORMAL HIGH (ref 0–99)
NonHDL: 134.62
Total CHOL/HDL Ratio: 3
Triglycerides: 106 mg/dL (ref 0.0–149.0)
VLDL: 21.2 mg/dL (ref 0.0–40.0)

## 2021-09-23 LAB — CBC
HCT: 41.1 % (ref 36.0–46.0)
Hemoglobin: 13.9 g/dL (ref 12.0–15.0)
MCHC: 33.8 g/dL (ref 30.0–36.0)
MCV: 96.6 fl (ref 78.0–100.0)
Platelets: 266 10*3/uL (ref 150.0–400.0)
RBC: 4.25 Mil/uL (ref 3.87–5.11)
RDW: 12.8 % (ref 11.5–15.5)
WBC: 5.2 10*3/uL (ref 4.0–10.5)

## 2021-09-23 LAB — COMPREHENSIVE METABOLIC PANEL
ALT: 16 U/L (ref 0–35)
AST: 14 U/L (ref 0–37)
Albumin: 4.3 g/dL (ref 3.5–5.2)
Alkaline Phosphatase: 68 U/L (ref 39–117)
BUN: 17 mg/dL (ref 6–23)
CO2: 27 mEq/L (ref 19–32)
Calcium: 10.1 mg/dL (ref 8.4–10.5)
Chloride: 102 mEq/L (ref 96–112)
Creatinine, Ser: 0.95 mg/dL (ref 0.40–1.20)
GFR: 65.74 mL/min (ref >60.00)
Glucose, Bld: 95 mg/dL (ref 70–99)
Potassium: 4.4 mEq/L (ref 3.5–5.1)
Sodium: 138 mEq/L (ref 135–145)
Total Bilirubin: 0.5 mg/dL (ref 0.2–1.2)
Total Protein: 7.1 g/dL (ref 6.0–8.3)

## 2021-09-23 LAB — HEMOGLOBIN A1C: Hgb A1c MFr Bld: 6.1 % (ref 4.6–6.5)

## 2021-09-23 LAB — TSH: TSH: 3.89 u[IU]/mL (ref 0.35–5.50)

## 2021-10-09 ENCOUNTER — Telehealth (HOSPITAL_BASED_OUTPATIENT_CLINIC_OR_DEPARTMENT_OTHER): Payer: Self-pay

## 2021-11-14 ENCOUNTER — Ambulatory Visit (HOSPITAL_BASED_OUTPATIENT_CLINIC_OR_DEPARTMENT_OTHER)
Admission: RE | Admit: 2021-11-14 | Discharge: 2021-11-14 | Disposition: A | Discharge status: Home / Self Care | Payer: BC Managed Care – PPO | Source: Ambulatory Visit | Attending: Family Medicine | Admitting: Family Medicine

## 2021-11-14 ENCOUNTER — Encounter: Payer: Self-pay | Admitting: Family Medicine

## 2021-11-14 ENCOUNTER — Other Ambulatory Visit (HOSPITAL_BASED_OUTPATIENT_CLINIC_OR_DEPARTMENT_OTHER): Payer: Self-pay

## 2021-11-14 DIAGNOSIS — E2839 Other primary ovarian failure: Secondary | ICD-10-CM | POA: Insufficient documentation

## 2021-11-14 DIAGNOSIS — M8580 Other specified disorders of bone density and structure, unspecified site: Secondary | ICD-10-CM | POA: Insufficient documentation

## 2021-11-14 MED ORDER — FLUARIX QUADRIVALENT 0.5 ML IM SUSY
PREFILLED_SYRINGE | INTRAMUSCULAR | 0 refills | Status: DC
  Filled 2021-11-14: qty 0.5, 1d supply, fill #0

## 2021-11-22 ENCOUNTER — Other Ambulatory Visit: Payer: Self-pay | Admitting: Family Medicine

## 2021-11-22 DIAGNOSIS — Z1231 Encounter for screening mammogram for malignant neoplasm of breast: Secondary | ICD-10-CM

## 2021-12-23 ENCOUNTER — Other Ambulatory Visit (HOSPITAL_BASED_OUTPATIENT_CLINIC_OR_DEPARTMENT_OTHER): Payer: Self-pay

## 2021-12-23 MED ORDER — COMIRNATY 30 MCG/0.3ML IM SUSY
PREFILLED_SYRINGE | INTRAMUSCULAR | 0 refills | Status: DC
  Filled 2021-12-23: qty 0.3, 1d supply, fill #0

## 2022-01-16 ENCOUNTER — Ambulatory Visit: Payer: BC Managed Care – PPO

## 2022-03-13 LAB — RESULTS CONSOLE HPV: CHL HPV: NEGATIVE

## 2022-03-13 LAB — HM PAP SMEAR: HM Pap smear: NORMAL

## 2022-06-05 LAB — HM MAMMOGRAPHY

## 2022-07-05 NOTE — Progress Notes (Unsigned)
[facility] at [facility] [address], [address] [place], [place] [address] [phone]  Date:  07/09/2022   Name:  Holly Keller   DOB:  Jun 19, 1960   MRN:  [id number]  PCP:  Holly Cables, MD    Chief Complaint: ear discomfort (Concerns/ questions: pt says she got water in her L ear memorial Day- weekend. And she is traveling in the next few days. /(Letter sent for PAP))   History of Present Illness:  Holly Keller is a 62 y.o. very pleasant female patient who presents with the following:  Patient seen today with concern of fluid in her ear Most recent visit with myself was for her physical in August of last year  History of factor V Leiden mutation, solitary kidney secondary to nephrectomy for suspected cancer 2017-kidney turned out to be benign Also history of osteomyelitis in her clavicle in 2011, prediabetes  She was at the beach over memorial day and got some water in her LEFT ear- it seemed to take it a really long time to get the water out.  Just her left ear is affected- she has used some OTC ear drops to remove water which did help It does not hurt, hearing is ok but just seems a little "cloudy" like there might still be water in her ear.  She notes her sx are getting better and she thought about canceling appt- however, she is traveling by plane later this week so wanted to make sure all is well   Gynecologist is Holly Keller- they did her mammo  Married to Holly Keller, her son is a Pension scheme manager- will request report  Complete labs done in August Asked her to come in for CPE in August  Patient Active Problem List   Diagnosis Date Noted   Osteopenia 11/14/2021   Solitary kidney, acquired 04/14/2016   Microhematuria 04/14/2016   Renal mass, right 01/16/2016   METHICILLIN SUSCEPTIBLE STAPH AUREUS SEPTICEMIA 09/26/2009   SEPTIC ARTHRITIS 09/26/2009   ACUTE OSTEOMYELITIS OTHER SPECIFIED SITE 09/26/2009   UNSPECIFIED DISEASE  OF SEBACEOUS GLANDS 05/22/2008   Factor 5 Leiden mutation, heterozygous (HCC) 12/27/2007   ACNE VULGARIS, FACIAL 12/27/2007   ANKLE SPRAIN, RIGHT 12/27/2007    Past Medical History:  Diagnosis Date   Bradycardia 2017   HR down to the 40's   Chronic kidney disease    Factor V deficiency (HCC) 2001   Heart murmur    Recent URI    denies any fever, clear drainage from head   Right renal mass     Past Surgical History:  Procedure Laterality Date   FOOT SURGERY Bilateral    bunionectomy   FRACTURE SURGERY Left    middle finger   LAPAROSCOPIC NEPHRECTOMY Right 01/16/2016   Procedure: LAPAROSCOPIC RADICAL NEPHRECTOMY;  Surgeon: Holly Fat, MD;  Location: [facility];  Service: Urology;  Laterality: Right;   OTHER SURGICAL HISTORY Right    I&D right clavicle    Social History   Tobacco Use   Smoking status: Former    Packs/day: .25    Types: Cigarettes    Quit date: 01/27/2013    Years since quitting: 9.4   Smokeless tobacco: Never  Substance Use Topics   Alcohol use: Yes    Alcohol/week: 21.0 standard drinks of alcohol    Types: 21 Glasses of wine per week   Drug use: No    Family History  Problem Relation Age of  Onset   Alcoholism Other    Arthritis Other    Elevated Lipids Other    Hypertension Other     No Known Allergies  Medication list has been reviewed and updated.  Current Outpatient Medications on File Prior to Visit  Medication Sig Dispense Refill   acetaminophen (TYLENOL) 500 MG tablet Take 1,000 mg by mouth every 6 (six) hours as needed for moderate pain.     aspirin EC 81 MG tablet Take 81 mg by mouth daily. Swallow whole.     No current facility-administered medications on file prior to visit.    Review of Systems:  As per HPI- otherwise negative.   Physical Examination: Vitals:   07/09/22 0853  BP: 112/70  Pulse: 68  Resp: 18  Temp: 97.9 F (36.6 C)  SpO2: 97%   Vitals:   07/09/22 0853  Weight: 175 lb 12.8 oz (79.7 kg)   Height: 5\' 4"  (1.626 m)   Body mass index is 30.18 kg/m. Ideal Body Weight: Weight in (lb) to have BMI = 25: 145.3  GEN: no acute distress. Mild obesity, looks well  HEENT: Atraumatic, Normocephalic.  Bilateral TM wnl, oropharynx normal.  PEERL,EOMI.   Both ear canals are normal to exam Ears and Nose: No external deformity. CV: RRR, No M/G/R. No JVD. No thrill. No extra heart sounds. PULM: CTA B, no wheezes, crackles, rhonchi. No retractions. No resp. distress. No accessory muscle use. ABD: S, NT, ND, +BS. No rebound. No HSM. EXTR: No c/c/e PSYCH: Normally interactive. Conversant.    Assessment and Plan: Ear discomfort, left - Plan: acetic acid-hydrocortisone (VOSOL-HC) OTIC solution Seen today with non- specific ear discomfort - likely due to getting some water trapped in her ear recently. Not painful and she notes hearing is ok, seems to all be improving ------------  It was good to see you again today- have a wonderful time in [place] Try some afrin nasal spray prior to your trip You can try the ear drops as needed for any continued discomfort The rubbing alcohol/ white vinegar 50/50 can be very useful after swimming to dry your ears Just let me know if your have any more concerns about your ears!    Signed Holly Amsterdam, MD

## 2022-07-09 ENCOUNTER — Ambulatory Visit: Payer: BC Managed Care – PPO | Admitting: Family Medicine

## 2022-07-09 VITALS — BP 112/70 | HR 68 | Temp 97.9°F | Resp 18 | Ht 64.0 in | Wt 175.8 lb

## 2022-07-09 DIAGNOSIS — H9202 Otalgia, left ear: Secondary | ICD-10-CM | POA: Diagnosis not present

## 2022-07-09 MED ORDER — HYDROCORTISONE-ACETIC ACID 1-2 % OT SOLN: 3.0000 [drp] | Freq: Three times a day (TID) | OTIC | 0 refills | Status: DC

## 2022-07-09 NOTE — Patient Instructions (Signed)
It was good to see you again today- have a wonderful time in [place] Try some afrin nasal spray prior to your trip You can try the ear drops as needed for any continued discomfort The rubbing alcohol/ white vinegar 50/50 can be very useful after swimming to dry your ears Just let me know if your have any more concerns about your ears!

## 2022-09-11 ENCOUNTER — Encounter (INDEPENDENT_AMBULATORY_CARE_PROVIDER_SITE_OTHER): Payer: Self-pay

## 2022-09-28 NOTE — Patient Instructions (Incomplete)
It was good to see you today- I will be in touch with your labs asap!  ?

## 2022-09-28 NOTE — Progress Notes (Unsigned)
[facility] at [facility] [place] [address] [place], [place] [address] [phone]  Date:  10/01/2022   Name:  Holly Keller   DOB:  Sep 18, 1960   MRN:  [id number]  PCP:  Pearline Cables, MD    Chief Complaint: No chief complaint on file.   History of Present Illness:  Holly Keller is a 62 y.o. very pleasant female patient who presents with the following:  Pt seen today for a CPE Last seen by myself just in June with an earache She has GYN care- Dr Langston Masker  History of factor V Leiden mutation, solitary kidney secondary to nephrectomy for suspected cancer 2017-kidney turned out to be benign Also history of osteomyelitis in her clavicle in 2011, prediabetes  Her son is a Holiday representative at [employer] Married to [employer]  Pap UTD Colon  Flu and covid booster    Patient Active Problem List   Diagnosis Date Noted   Osteopenia 11/14/2021   Solitary kidney, acquired 04/14/2016   Microhematuria 04/14/2016   Renal mass, right 01/16/2016   METHICILLIN SUSCEPTIBLE STAPH AUREUS SEPTICEMIA 09/26/2009   SEPTIC ARTHRITIS 09/26/2009   ACUTE OSTEOMYELITIS OTHER SPECIFIED SITE 09/26/2009   UNSPECIFIED DISEASE OF SEBACEOUS GLANDS 05/22/2008   Factor 5 Leiden mutation, heterozygous (HCC) 12/27/2007   ACNE VULGARIS, FACIAL 12/27/2007   ANKLE SPRAIN, RIGHT 12/27/2007    Past Medical History:  Diagnosis Date   Bradycardia 2017   HR down to the 40's   Chronic kidney disease    Factor V deficiency (HCC) 2001   Heart murmur    Recent URI    denies any fever, clear drainage from head   Right renal mass     Past Surgical History:  Procedure Laterality Date   FOOT SURGERY Bilateral    bunionectomy   FRACTURE SURGERY Left    middle finger   LAPAROSCOPIC NEPHRECTOMY Right 01/16/2016   Procedure: LAPAROSCOPIC RADICAL NEPHRECTOMY;  Surgeon: Crist Fat, MD;  Location: [facility];  Service: Urology;  Laterality: Right;   OTHER SURGICAL HISTORY  Right    I&D right clavicle    Social History   Tobacco Use   Smoking status: Former    Current packs/day: 0.00    Types: Cigarettes    Quit date: 01/27/2013    Years since quitting: 9.6   Smokeless tobacco: Never  Substance Use Topics   Alcohol use: Yes    Alcohol/week: 21.0 standard drinks of alcohol    Types: 21 Glasses of wine per week   Drug use: No    Family History  Problem Relation Age of Onset   Alcoholism Other    Arthritis Other    Elevated Lipids Other    Hypertension Other     No Known Allergies  Medication list has been reviewed and updated.  Current Outpatient Medications on File Prior to Visit  Medication Sig Dispense Refill   acetaminophen (TYLENOL) 500 MG tablet Take 1,000 mg by mouth every 6 (six) hours as needed for moderate pain.     acetic acid-hydrocortisone (VOSOL-HC) OTIC solution Place 3 drops into the left ear 3 (three) times daily. 10 mL 0   aspirin EC 81 MG tablet Take 81 mg by mouth daily. Swallow whole.     No current facility-administered medications on file prior to visit.    Review of Systems:  As per HPI- otherwise negative.   Physical Examination: There were no vitals filed for this  visit. There were no vitals filed for this visit. There is no height or weight on file to calculate BMI. Ideal Body Weight:    GEN: no acute distress. HEENT: Atraumatic, Normocephalic.  Ears and Nose: No external deformity. CV: RRR, No M/G/R. No JVD. No thrill. No extra heart sounds. PULM: CTA B, no wheezes, crackles, rhonchi. No retractions. No resp. distress. No accessory muscle use. ABD: S, NT, ND, +BS. No rebound. No HSM. EXTR: No c/c/e PSYCH: Normally interactive. Conversant.    Assessment and Plan: *** Physical exam- recommended healthy diet and exercise routine Will plan further follow- up pending labs.  Signed Abbe Amsterdam, MD

## 2022-10-01 ENCOUNTER — Telehealth: Payer: Self-pay

## 2022-10-01 ENCOUNTER — Ambulatory Visit (INDEPENDENT_AMBULATORY_CARE_PROVIDER_SITE_OTHER): Payer: BC Managed Care – PPO | Admitting: Family Medicine

## 2022-10-01 ENCOUNTER — Encounter: Payer: Self-pay | Admitting: Family Medicine

## 2022-10-01 VITALS — BP 110/62 | HR 63 | Temp 98.1°F | Resp 18 | Ht 64.0 in | Wt 181.6 lb

## 2022-10-01 DIAGNOSIS — Z1329 Encounter for screening for other suspected endocrine disorder: Secondary | ICD-10-CM | POA: Diagnosis not present

## 2022-10-01 DIAGNOSIS — Z131 Encounter for screening for diabetes mellitus: Secondary | ICD-10-CM | POA: Diagnosis not present

## 2022-10-01 DIAGNOSIS — R5383 Other fatigue: Secondary | ICD-10-CM | POA: Diagnosis not present

## 2022-10-01 DIAGNOSIS — Z13220 Encounter for screening for lipoid disorders: Secondary | ICD-10-CM

## 2022-10-01 DIAGNOSIS — Z905 Acquired absence of kidney: Secondary | ICD-10-CM

## 2022-10-01 DIAGNOSIS — Z0001 Encounter for general adult medical examination with abnormal findings: Secondary | ICD-10-CM

## 2022-10-01 DIAGNOSIS — Z23 Encounter for immunization: Secondary | ICD-10-CM | POA: Diagnosis not present

## 2022-10-01 DIAGNOSIS — F4321 Adjustment disorder with depressed mood: Secondary | ICD-10-CM

## 2022-10-01 DIAGNOSIS — Z0000 Encounter for general adult medical examination without abnormal findings: Secondary | ICD-10-CM

## 2022-10-01 LAB — COMPREHENSIVE METABOLIC PANEL
ALT: 20 U/L (ref 0–35)
AST: 18 U/L (ref 0–37)
Albumin: 4.2 g/dL (ref 3.5–5.2)
Alkaline Phosphatase: 72 U/L (ref 39–117)
BUN: 21 mg/dL (ref 6–23)
CO2: 28 meq/L (ref 19–32)
Calcium: 9.8 mg/dL (ref 8.4–10.5)
Chloride: 106 meq/L (ref 96–112)
Creatinine, Ser: 1 mg/dL (ref 0.40–1.20)
GFR: 61.37 mL/min (ref >60.00)
Glucose, Bld: 101 mg/dL — ABNORMAL HIGH (ref 70–99)
Potassium: 4.8 meq/L (ref 3.5–5.1)
Sodium: 140 meq/L (ref 135–145)
Total Bilirubin: 0.4 mg/dL (ref 0.2–1.2)
Total Protein: 7.3 g/dL (ref 6.0–8.3)

## 2022-10-01 LAB — CBC
HCT: 43.9 % (ref 36.0–46.0)
Hemoglobin: 14.5 g/dL (ref 12.0–15.0)
MCHC: 32.9 g/dL (ref 30.0–36.0)
MCV: 98 fl (ref 78.0–100.0)
Platelets: 235 10*3/uL (ref 150.0–400.0)
RBC: 4.48 Mil/uL (ref 3.87–5.11)
RDW: 12.7 % (ref 11.5–15.5)
WBC: 4.9 10*3/uL (ref 4.0–10.5)

## 2022-10-01 LAB — LIPID PANEL
Cholesterol: 189 mg/dL (ref 0–200)
HDL: 61.3 mg/dL (ref >39.00)
LDL Cholesterol: 110 mg/dL — ABNORMAL HIGH (ref 0–99)
NonHDL: 127.56
Total CHOL/HDL Ratio: 3
Triglycerides: 86 mg/dL (ref 0.0–149.0)
VLDL: 17.2 mg/dL (ref 0.0–40.0)

## 2022-10-01 LAB — TSH: TSH: 2.88 u[IU]/mL (ref 0.35–5.50)

## 2022-10-01 LAB — VITAMIN D 25 HYDROXY (VIT D DEFICIENCY, FRACTURES): VITD: 43.11 ng/mL (ref 30.00–100.00)

## 2022-10-01 LAB — HEMOGLOBIN A1C: Hgb A1c MFr Bld: 5.9 % (ref 4.6–6.5)

## 2022-10-01 NOTE — Telephone Encounter (Signed)
Correspondence sent back stating that they do not have any records for the pts past colonoscopy.

## 2022-10-10 ENCOUNTER — Encounter: Payer: Self-pay | Admitting: Family Medicine

## 2022-11-10 NOTE — Progress Notes (Unsigned)
[facility] at [facility] [address], [address] [place], [place] [address] [phone]  Date:  11/13/2022   Name:  Holly Keller   DOB:  1960/07/09   MRN:  [id number]  PCP:  Pearline Cables, MD    Chief Complaint: No chief complaint on file.   History of Present Illness:  Holly Keller is a 62 y.o. very pleasant female patient who presents with the following:  Patient seen today with concern of anxiety Most recent visit with myself was about 6 weeks ago for her physical History of factor V Leiden mutation, solitary kidney secondary to nephrectomy for suspected cancer 2017-kidney turned out to be benign Also history of osteomyelitis in her clavicle in 2011, prediabetes  At her last visit she mention some depression and anxiety symptoms over the last year or so, she was seeing a therapist and trying to get more exercise-she did not wish to start medication at that time  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   Sprain of ankle 12/27/2007    Past Medical History:  Diagnosis Date   Bradycardia 2017   HR down to the 40's   Chronic kidney disease    Factor V deficiency (HCC) 2001   Heart murmur    Recent URI    denies any fever, clear drainage from head   Right renal mass     Past Surgical History:  Procedure Laterality Date   FOOT SURGERY Bilateral    bunionectomy   FRACTURE SURGERY Left    middle finger   LAPAROSCOPIC NEPHRECTOMY Right 01/16/2016   Procedure: LAPAROSCOPIC RADICAL NEPHRECTOMY;  Surgeon:  Crist Fat, MD;  Location: [facility];  Service: Urology;  Laterality: Right;   OTHER SURGICAL HISTORY Right    I&D right clavicle    Social History   Tobacco Use   Smoking status: Former    Current packs/day: 0.00    Types: Cigarettes    Quit date: 01/27/2013    Years since quitting: 9.7   Smokeless tobacco: Never  Substance Use Topics   Alcohol use: Yes    Alcohol/week: 21.0 standard drinks of alcohol    Types: 21 Glasses of wine per week   Drug use: No    Family History  Problem Relation Age of Onset   Alcoholism Other    Arthritis Other    Elevated Lipids Other    Hypertension Other     No Known Allergies  Medication list has been reviewed and updated.  Current Outpatient Medications on File Prior to Visit  Medication Sig Dispense Refill   acetaminophen (TYLENOL) 500 MG tablet Take 1,000 mg by mouth every 6 (six) hours as needed for moderate pain.     aspirin EC 81 MG tablet Take 81 mg by mouth daily. Swallow whole.     No current facility-administered medications on file prior to visit.    Review of Systems:  As per HPI- otherwise negative.   Physical Examination: There were no vitals filed for this visit. There were no vitals filed for this visit. There  is no height or weight on file to calculate BMI. Ideal Body Weight:    GEN: no acute distress. HEENT: Atraumatic, Normocephalic.  Ears and Nose: No external deformity. CV: RRR, No M/G/R. No JVD. No thrill. No extra heart sounds. PULM: CTA B, no wheezes, crackles, rhonchi. No retractions. No resp. distress. No accessory muscle use. ABD: S, NT, ND, +BS. No rebound. No HSM. EXTR: No c/c/e PSYCH: Normally interactive. Conversant.    Assessment and Plan: ***  Signed Abbe Amsterdam, MD

## 2022-11-13 ENCOUNTER — Ambulatory Visit: Payer: BC Managed Care – PPO | Admitting: Family Medicine

## 2022-11-13 VITALS — BP 138/84 | HR 83 | Temp 98.0°F | Resp 18 | Ht 64.0 in | Wt 180.2 lb

## 2022-11-13 DIAGNOSIS — Z716 Tobacco abuse counseling: Secondary | ICD-10-CM

## 2022-11-13 DIAGNOSIS — F4322 Adjustment disorder with anxiety: Secondary | ICD-10-CM | POA: Diagnosis not present

## 2022-11-13 MED ORDER — HYDROXYZINE PAMOATE 25 MG PO CAPS
25.0000 mg | ORAL_CAPSULE | Freq: Three times a day (TID) | ORAL | 1 refills | Status: AC | PRN
Start: 2022-11-13 — End: ?

## 2022-11-13 MED ORDER — FLUOXETINE HCL 20 MG PO CAPS
20.0000 mg | ORAL_CAPSULE | Freq: Every day | ORAL | 3 refills | Status: DC
Start: 2022-11-13 — End: 2023-10-29

## 2022-11-13 NOTE — Patient Instructions (Addendum)
It was good to see you today - I am sorry you are under stress!   We will get you started on hydroxyzine 25 mg up to three times a day as needed for anxiety.  This can make you feel a bit drowsy- use with caution!   Let's also have you try fluoxetine/ Prozac 20 mg - take one daily for 2 weeks, then increase to 40 mg after 2 weeks if you like.  Please let me know how this works for you- if you don't like it for any reason please alert me   Work on quitting tobacco as we get your anxiety under control.  I would suggest cutting down gradually, or might try patches/ gum to wean off the nicotine

## 2022-11-18 ENCOUNTER — Telehealth: Payer: Self-pay | Admitting: Family Medicine

## 2022-11-18 NOTE — Telephone Encounter (Signed)
Pt called and stated that she needs the office notes/ summary for her insurance for when she was seen with pcp on 11/13/22. She requested for it to be emailed to her at [email or web address].

## 2022-11-18 NOTE — Telephone Encounter (Signed)
Spoke with Marylene Land and she advised me to supply the AVS. If insurance needs more they will need to send medical record release.AVS was sent to the pts email address as well as a message letting her know if she would need to sign a release if insurance needed more.

## 2022-12-10 ENCOUNTER — Ambulatory Visit: Payer: BC Managed Care – PPO | Admitting: Medical

## 2022-12-10 ENCOUNTER — Ambulatory Visit: Payer: BC Managed Care – PPO | Admitting: Family Medicine

## 2022-12-10 VITALS — BP 140/85 | HR 87 | Temp 98.0°F | Resp 16 | Ht 64.0 in | Wt 176.6 lb

## 2022-12-10 DIAGNOSIS — R040 Epistaxis: Secondary | ICD-10-CM | POA: Diagnosis not present

## 2022-12-10 DIAGNOSIS — R5383 Other fatigue: Secondary | ICD-10-CM | POA: Diagnosis not present

## 2022-12-10 DIAGNOSIS — R030 Elevated blood-pressure reading, without diagnosis of hypertension: Secondary | ICD-10-CM | POA: Diagnosis not present

## 2022-12-10 LAB — CBC WITH DIFFERENTIAL/PLATELET
Basophils Absolute: 0 10*3/uL (ref 0.0–0.1)
Basophils Relative: 0.3 % (ref 0.0–3.0)
Eosinophils Absolute: 0 10*3/uL (ref 0.0–0.7)
Eosinophils Relative: 0.1 % (ref 0.0–5.0)
HCT: 39.8 % (ref 36.0–46.0)
Hemoglobin: 13.4 g/dL (ref 12.0–15.0)
Lymphocytes Relative: 9.6 % — ABNORMAL LOW (ref 12.0–46.0)
Lymphs Abs: 0.9 10*3/uL (ref 0.7–4.0)
MCHC: 33.6 g/dL (ref 30.0–36.0)
MCV: 97 fL (ref 78.0–100.0)
Monocytes Absolute: 0.4 10*3/uL (ref 0.1–1.0)
Monocytes Relative: 3.8 % (ref 3.0–12.0)
Neutro Abs: 8 10*3/uL — ABNORMAL HIGH (ref 1.4–7.7)
Neutrophils Relative %: 86.2 % — ABNORMAL HIGH (ref 43.0–77.0)
Platelets: 296 10*3/uL (ref 150.0–400.0)
RBC: 4.11 Mil/uL (ref 3.87–5.11)
RDW: 12.5 % (ref 11.5–15.5)
WBC: 9.3 10*3/uL (ref 4.0–10.5)

## 2022-12-10 LAB — COMPREHENSIVE METABOLIC PANEL
ALT: 30 U/L (ref 0–35)
AST: 18 U/L (ref 0–37)
Albumin: 4.5 g/dL (ref 3.5–5.2)
Alkaline Phosphatase: 64 U/L (ref 39–117)
BUN: 36 mg/dL — ABNORMAL HIGH (ref 6–23)
CO2: 26 meq/L (ref 19–32)
Calcium: 9.9 mg/dL (ref 8.4–10.5)
Chloride: 103 meq/L (ref 96–112)
Creatinine, Ser: 0.86 mg/dL (ref 0.40–1.20)
GFR: 73.45 mL/min (ref >60.00)
Glucose, Bld: 125 mg/dL — ABNORMAL HIGH (ref 70–99)
Potassium: 4.7 meq/L (ref 3.5–5.1)
Sodium: 138 meq/L (ref 135–145)
Total Bilirubin: 0.5 mg/dL (ref 0.2–1.2)
Total Protein: 7.4 g/dL (ref 6.0–8.3)

## 2022-12-10 NOTE — Progress Notes (Signed)
Subjective:    Patient ID: Holly Keller, female    DOB: 03/07/1960, 62 y.o.   MRN: [id number]  HPI  Discussed the use of AI scribe software for clinical note transcription with the patient, who gave verbal consent to proceed.  History of Present Illness   The patient, with a history of hypertension, presented with a recent episode of severe nosebleed from the left nostril. She reported a history of intermittent left-sided nosebleeds over the past year, occurring approximately twice a month. The patient noted the presence of a scab in the left nostril, which she has been picking at, for about a year. The most recent nosebleed was significant and difficult to control, causing the patient to consider visiting urgent care. However, the bleeding eventually stopped on its own last night and has not reoccurred.  In addition to the nosebleeds, the patient reported a recent illness, which she initially suspected to be COVID-19 due to severe symptoms including a sore throat and feeling of being in a 'cloud.' She tested negative for COVID-19. The patient reported that she still feels fatigued and not fully recovered from this illness. She also expressed concern about her blood pressure, which has been higher than her usual readings.    The patient acknowledged recent stress and anxiety, which she believes may be contributing to her elevated blood pressure.         Review of Systems  Constitutional:  Positive for fatigue. Negative for chills and fever.  HENT:  Negative for congestion, facial swelling, postnasal drip and rhinorrhea.        See hpi.  Respiratory:  Negative for cough, chest tightness, wheezing and stridor.   Cardiovascular:  Negative for chest pain and palpitations.  Neurological:  Negative for dizziness and light-headedness.    Past Medical History:  Diagnosis Date   Bradycardia 2017   HR down to the 40's   Chronic kidney disease    Factor V deficiency (HCC) 2001   Heart murmur     Recent URI    denies any fever, clear drainage from head   Right renal mass      Social History   Socioeconomic History   Marital status: Married    Spouse name: Not on file   Number of children: Not on file   Years of education: Not on file   Highest education level: Not on file  Occupational History   Not on file  Tobacco Use   Smoking status: Former    Current packs/day: 0.00    Types: Cigarettes    Quit date: 01/27/2013    Years since quitting: 9.8   Smokeless tobacco: Never  Substance and Sexual Activity   Alcohol use: Yes    Alcohol/week: 21.0 standard drinks of alcohol    Types: 21 Glasses of wine per week   Drug use: No   Sexual activity: Not on file  Other Topics Concern   Not on file  Social History Narrative   Sister also has Factor V had blood clot in her leg 2001   Social Determinants of Health   Financial Resource Strain: Not on file  Food Insecurity: Not on file  Transportation Needs: Not on file  Physical Activity: Not on file  Stress: Not on file  Social Connections: Not on file  Intimate Partner Violence: Not on file    Past Surgical History:  Procedure Laterality Date   FOOT SURGERY Bilateral    bunionectomy   FRACTURE SURGERY Left  middle finger   LAPAROSCOPIC NEPHRECTOMY Right 01/16/2016   Procedure: LAPAROSCOPIC RADICAL NEPHRECTOMY;  Surgeon: Crist Fat, MD;  Location: [facility];  Service: Urology;  Laterality: Right;   OTHER SURGICAL HISTORY Right    I&D right clavicle    Family History  Problem Relation Age of Onset   Alcoholism Other    Arthritis Other    Elevated Lipids Other    Hypertension Other     No Known Allergies  Current Outpatient Medications on File Prior to Visit  Medication Sig Dispense Refill   acetaminophen (TYLENOL) 500 MG tablet Take 1,000 mg by mouth every 6 (six) hours as needed for moderate pain.     aspirin EC 81 MG tablet Take 81 mg by mouth daily. Swallow whole.     FLUoxetine (PROZAC) 20 MG  capsule Take 1 capsule (20 mg total) by mouth daily. Increase to 40 mg after 2 weeks 60 capsule 3   hydrOXYzine (VISTARIL) 25 MG capsule Take 1 capsule (25 mg total) by mouth every 8 (eight) hours as needed. Use as needed for anxiety 45 capsule 1   No current facility-administered medications on file prior to visit.    BP (!) 140/85   Pulse 87   Temp 98 F (36.7 C) (Oral)   Resp 16   Ht 5\' 4"  (1.626 m)   Wt 176 lb 9.6 oz (80.1 kg)   LMP 06/28/2011   BMI 30.31 kg/m        Objective:   Physical Exam  General- No acute distress. Pleasant patient. Neck- Full range of motion, no jvd Lungs- Clear, even and unlabored. Heart- regular rate and rhythm. Neurologic- CNII- XII grossly intact.  Heent- no sinus presssure. Normal pharynx. No pnd. Nose- no nose bleed. I can't appreciate any scab in left nostril though can see some dried blood on wall of nares.    Assessment & Plan:   Assessment and Plan    Epistaxis Recurrent left-sided nosebleeds with a history of a scab in the left nostril. Recent episode was difficult to control. No current active bleeding. -Refer to ENT for further evaluation and management. -Provide patient with education on nosebleed management, including the use of Afrin as a last resort before seeking emergency care.(explained how to use)  Hypertension Elevated blood pressure in the office, significantly higher than previous readings. Patient reports recent stress and anxiety. -Advise patient to monitor blood pressure at home daily and report readings in 4 days. -Consider initiation of low-dose antihypertensive medication if home readings remain elevated.  Fatigue Patient reports ongoing fatigue following a recent illness with symptoms suggestive of a viral syndrome. -Order CBC and metabolic panel to evaluate for potential causes of fatigue.        Follow up date to be determined after lab review and bp update.  Esperanza Richters, PA-C

## 2022-12-10 NOTE — Patient Instructions (Addendum)
Epistaxis Recurrent left-sided nosebleeds with a history of a scab in the left nostril. Recent episode was difficult to control. No current active bleeding. -Refer to ENT for further evaluation and management. -Provide patient with education on nosebleed management, including the use of Afrin as a last resort before seeking emergency care.(explained how to use)  Hypertension Elevated blood pressure in the office, significantly higher than previous readings. Patient reports recent stress and anxiety. -Advise patient to monitor blood pressure at home daily and report readings in 4 days. -Consider initiation of low-dose antihypertensive medication if home readings remain elevated.  Fatigue Patient reports ongoing fatigue following a recent illness with symptoms suggestive of a viral syndrome. -Order CBC and metabolic panel to evaluate for potential causes of fatigue.  Follow up date to be determined after lab review and bp update.       Nosebleed, Adult A nosebleed is when blood comes out of the nose. Nosebleeds are common. Usually, they are not a sign of a serious condition. Nosebleeds can happen if a blood vessel in your nose starts to bleed or if the lining of your nose (mucous membrane) cracks. They are commonly caused by: Allergies. Colds. Picking your nose. Blowing your nose too hard. An injury from sticking an object into your nose or getting hit in the nose. Dry or cold air. Less common causes of nosebleeds include: Toxic fumes. Something abnormal in the nose or in the air-filled spaces in the bones of the face (sinuses). Growths in the nose, such as polyps. Blood thinners or conditions that cause blood to clot slowly. Certain illnesses or procedures that irritate or dry out the nasal passages. Follow these instructions at home: When you have a nosebleed:  Sit down and tilt your head slightly forward. Use a clean towel or tissue to pinch your nostrils under the bony part  of your nose. After 5 minutes, let go of your nose and see if bleeding starts again. Do not release pressure before that time. If there is still bleeding, repeat the pinching and holding for 5 minutes or until the bleeding stops. Do not place tissues or gauze in the nose to stop the bleeding. Avoid lying down and avoid tilting your head backward. That may make blood collect in the throat and cause gagging or coughing. Use a nasal spray decongestant to help with a nosebleed as told by your health care provider. After a nosebleed: Avoid blowing your nose or sniffing for a number of hours. Avoid straining, lifting, or bending at the waist for several days. You may go back to other normal activities as you are able. If you are taking aspirin or blood thinners and you have nosebleeds, talk to your health care provider. These medicines make bleeding more likely. Ask your health care provider if you should stop taking the medicines or if you should adjust the dose. Do not stop taking medicines that your health care provider has recommended unless he or she tells you to stop taking them. If your nosebleed was caused by dry mucous membranes, use over-the-counter saline nasal spray or gel and a humidifier as told by your health care provider. This will keep the mucous membranes moist and allow them to heal. If you need to use one of these products: Choose one that is water-soluble. Use only as much as you need and use it only as often as needed. Do not lie down right after you use it. If you get nosebleeds often, talk with your health care  provider about medical treatments. Options may include: Nasal cautery. This treatment stops and prevents nosebleeds by using a chemical swab or electrical device to lightly burn tiny blood vessels inside the nose. Nasal packing. A gauze or other material is placed in the nose to keep constant pressure on the bleeding area. Contact a health care provider if you: Have a  fever. Get nosebleeds often or more often than usual. Bruise very easily. Have a nosebleed from having something stuck in your nose. Have bleeding in your mouth. Vomit or cough up brown material. Have a nosebleed after you start a new medicine. Get help right away if: You have a nosebleed after a fall or a head injury. Your nosebleed does not go away after 20 minutes. You feel dizzy or weak. You have unusual bleeding from other parts of your body. You have unusual bruising on other parts of your body. You become sweaty. You vomit blood. Summary A nosebleed is when blood comes out of the nose. Common causes include allergies, an injury to the nose, or cold or dry air. Initial treatment includes applying pressure for 5 minutes. Moisturizing the nose with saline nasal spray or gel after a nosebleed may help prevent future bleeding. Get help right away if your nosebleed does not go away after 20 minutes. This information is not intended to replace advice given to you by your health care provider. Make sure you discuss any questions you have with your health care provider. Document Revised: 11/11/2018 Document Reviewed: 11/11/2018 Elsevier Patient Education  2022 [employer].

## 2023-04-22 LAB — RESULTS CONSOLE HPV: CHL HPV: NEGATIVE

## 2023-04-22 LAB — HM PAP SMEAR: HPV, high-risk: NEGATIVE

## 2023-06-15 LAB — HM MAMMOGRAPHY

## 2023-06-17 LAB — HM COLONOSCOPY

## 2023-09-02 ENCOUNTER — Telehealth: Payer: Self-pay

## 2023-09-02 NOTE — Telephone Encounter (Signed)
 Left voicemail to call office back to see if referral was completed.   Curtistine Quiet, CMA

## 2023-09-03 NOTE — Telephone Encounter (Unsigned)
 Copied from CRM [id number]. Topic: Clinical - Request for Lab/Test Order >> Sep 03, 2023 12:37 PM Harlene ORN wrote: Reason for CRM: Patient called to follow up on referral. Not sure what the referral is exactly? Please call back the patient.

## 2023-10-07 ENCOUNTER — Encounter: Payer: BC Managed Care – PPO | Admitting: Family Medicine

## 2023-10-12 ENCOUNTER — Encounter: Admitting: Family Medicine

## 2023-10-26 NOTE — Progress Notes (Addendum)
 [facility] [employer] [address], [address] [place], [place] [address] [phone]  Date:  10/29/2023   Name:  Holly Keller   DOB:  January 15, 1961   MRN:  [id number]  PCP:  Watt Harlene BROCKS, MD    Chief Complaint: Annual Exam   History of Present Illness:  Holly Keller is a 63 y.o. very pleasant female patient who presents with the following:  Patient seen today for physical exam.  I saw her most recently about 1 year ago.  At that time she was under some stress, she was dealing with some anxiety and depression symptoms and was seeing a therapist.  She she noted her husband lost his job and that she has started smoking to deal with stress.  I gave her a prescription for fluoxetine  at that time  History of factor V Leiden mutation, solitary kidney secondary to nephrectomy for suspected cancer 2017-kidney turned out to be benign. Also history of osteomyelitis in her clavicle in 2011, prediabetes   - Flu shot - Recommend COVID booster - Can give pneumonia vaccination if she would like - Mammogram can be updated- pt notes done earlier this year per her OBG Dr Grewal  - Pap smear completed last year per gynecology - Colon cancer screening; colonoscopy up-to-date per [facility] in May of this year Shingrix  is complete Discussed the use of AI scribe software for clinical note transcription with the patient, who gave verbal consent to proceed.  History of Present Illness Holly Keller is a 63 year old female who presents for medication management and routine health maintenance.  She previously started fluoxetine  but did not complete the first bottle due to difficulty with medication adherence rather than side effects. She has recently restarted the medication due to increasing stress and the need to quit smoking. She initially took fluoxetine  for about a week last year and is currently taking 20 mg daily, planning to increase to 40 mg after two weeks if  tolerated.  She has a history of smoking and has transitioned to vaping, expressing a desire to quit both. She also reports increased alcohol consumption recently, estimating about three glasses at times, but acknowledges fluctuations in her drinking habits.  She also recently completed a colonoscopy.  She is planning to go on a cruise to the [place] with a friend, as her husband cannot join due to work commitments. She expresses mixed feelings about the trip, noting her husband's regret over his job choice and her own anticipation of the cruise.  No chest pain or difficulty breathing during physical activity. She engages in walking for exercise.     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    Past Medical History:  Diagnosis Date   Bradycardia 2017   HR down to the 40's   Chronic kidney disease    Factor V deficiency (HCC) 2001   Heart murmur    Recent URI    denies any fever, clear drainage from head   Right renal mass     Past Surgical History:  Procedure Laterality Date   FOOT SURGERY Bilateral    bunionectomy   FRACTURE SURGERY Left    middle finger   LAPAROSCOPIC NEPHRECTOMY Right 01/16/2016   Procedure: LAPAROSCOPIC RADICAL NEPHRECTOMY;  Surgeon: Morene LELON Salines, MD;  Location: [facility];  Service: Urology;  Laterality: Right;   OTHER SURGICAL HISTORY Right    I&D right clavicle    Social History   Tobacco Use   Smoking status: Former    Current packs/day: 0.00    Types: Cigarettes    Quit date: 01/27/2013    Years since quitting: 10.7    Smokeless tobacco: Never  Substance Use Topics   Alcohol use: Yes    Alcohol/week: 21.0 standard drinks of alcohol    Types: 21 Glasses of wine per week   Drug use: No    Family History  Problem Relation Age of Onset   Alcoholism Other    Arthritis Other    Elevated Lipids Other    Hypertension Other     No Known Allergies  Medication list has been reviewed and updated.  Current Outpatient Medications on File Prior to Visit  Medication Sig Dispense Refill   acetaminophen  (TYLENOL ) 500 MG tablet Take 1,000 mg by mouth every 6 (six) hours as needed for moderate pain.     aspirin EC 81 MG tablet Take 81 mg by mouth daily. Swallow whole.     FLUoxetine  (PROZAC ) 20 MG capsule Take 1 capsule (20 mg total) by mouth daily. Increase to 40 mg after 2 weeks 60 capsule 3   hydrOXYzine  (VISTARIL ) 25 MG capsule Take 1 capsule (25 mg total) by mouth every 8 (eight) hours as needed. Use as needed for anxiety 45 capsule 1   No current facility-administered medications on file prior to visit.    Review of Systems:  As per HPI- otherwise negative.   Physical Examination: Vitals:   10/29/23 0900  BP: 120/78  Pulse: 66  Temp: 98.1 F (36.7 C)  SpO2: 98%   Vitals:   10/29/23 0900  Weight: 179 lb (81.2 kg)  Height: 5' 4 (1.626 m)   Body mass index is 30.73 kg/m. Ideal Body Weight: Weight in (lb) to have BMI = 25: 145.3  GEN: no acute distress. Mild obesity, looks well  HEENT: Atraumatic, Normocephalic. Bilateral TM wnl, oropharynx normal.  PEERL,EOMI.   Ears and Nose: No external deformity. CV: RRR, No M/G/R. No JVD. No thrill. No extra heart sounds. PULM: CTA B, no wheezes, crackles, rhonchi. No retractions. No resp. distress. No accessory muscle use. ABD: S, NT, ND. No rebound. No HSM. EXTR: No c/c/e PSYCH: Normally interactive. Conversant.    Assessment and Plan: Physical exam  Adjustment disorder with anxious mood  Screening for thyroid  disorder  Screening for  hyperlipidemia  Screening for diabetes mellitus  Solitary kidney, acquired  Screening for deficiency anemia Physical exam today.  Encouraged healthy diet and exercise routine Assessment & Plan Adult Wellness Visit Routine wellness visit with controlled blood pressure. Recent colonoscopy completed. Awaiting mammogram report. - Order blood work. - Request mammogram report from Dr. Oral. - Administer flu shot. - Recommend COVID booster from pharmacy.  Anxiety Anxiety managed with fluoxetine . Restarted treatment, currently in first week. Plans to increase dosage if tolerated. - Refill fluoxetine  prescription. - Instruct to increase fluoxetine  to 40 mg after two weeks if tolerated. She will keep me posted and I can go up on her rx to 40 mg as needed   Tobacco use Transitioned from smoking to  vaping. Desires cessation. - Encourage cessation of vaping.  Alcohol use above recommended limits Increased alcohol consumption. Advised reduction for health benefits. - Advise to limit alcohol to no more than one drink per day for women.  Signed Harlene Schroeder, MD  Received labs as below, message to patient  Results for orders placed or performed in visit on 10/29/23  CBC   Collection Time: 10/29/23  9:37 AM  Result Value Ref Range   WBC 5.5 4.0 - 10.5 K/uL   RBC 4.48 3.87 - 5.11 Mil/uL   Platelets 232.0 150.0 - 400.0 K/uL   Hemoglobin 14.4 12.0 - 15.0 g/dL   HCT 56.3 63.9 - 53.9 %   MCV 97.2 78.0 - 100.0 fl   MCHC 33.0 30.0 - 36.0 g/dL   RDW 87.1 88.4 - 84.4 %  Comprehensive metabolic panel with GFR   Collection Time: 10/29/23  9:37 AM  Result Value Ref Range   Sodium 138 135 - 145 mEq/L   Potassium 4.3 3.5 - 5.1 mEq/L   Chloride 104 96 - 112 mEq/L   CO2 26 19 - 32 mEq/L   Glucose, Bld 102 (H) 70 - 99 mg/dL   BUN 13 6 - 23 mg/dL   Creatinine, Ser 9.15 0.40 - 1.20 mg/dL   Total Bilirubin 0.5 0.2 - 1.2 mg/dL   Alkaline Phosphatase 64 39 - 117 U/L   AST 22 0 - 37 U/L    ALT 25 0 - 35 U/L   Total Protein 7.3 6.0 - 8.3 g/dL   Albumin 4.4 3.5 - 5.2 g/dL   GFR 24.90 >39.99 mL/min   Calcium 9.8 8.4 - 10.5 mg/dL  Hemoglobin J8r   Collection Time: 10/29/23  9:37 AM  Result Value Ref Range   Hgb A1c MFr Bld 6.0 4.6 - 6.5 %  Lipid panel   Collection Time: 10/29/23  9:37 AM  Result Value Ref Range   Cholesterol 163 0 - 200 mg/dL   Triglycerides 23.9 0.0 - 149.0 mg/dL   HDL 27.19 >60.99 mg/dL   VLDL 84.7 0.0 - 59.9 mg/dL   LDL Cholesterol 75 0 - 99 mg/dL   Total CHOL/HDL Ratio 2    NonHDL 90.20   TSH   Collection Time: 10/29/23  9:37 AM  Result Value Ref Range   TSH 2.49 0.35 - 5.50 uIU/mL    "

## 2023-10-26 NOTE — Patient Instructions (Incomplete)
 It was great to see you again today I will be in touch with your labs Recommend COVID booster this fall I do recommend gradually cutting down on alcohol to no more than one drink daily, and work on quitting vaping/ smoking  Flu shot today We can do a pneumonia shot for you at your convenience but don't have to do today since you are getting flu and covid   Have a wonderful time on your trip!!

## 2023-10-29 ENCOUNTER — Other Ambulatory Visit (HOSPITAL_BASED_OUTPATIENT_CLINIC_OR_DEPARTMENT_OTHER): Payer: Self-pay

## 2023-10-29 ENCOUNTER — Encounter: Payer: Self-pay | Admitting: Family Medicine

## 2023-10-29 ENCOUNTER — Ambulatory Visit: Admitting: Family Medicine

## 2023-10-29 VITALS — BP 120/78 | HR 66 | Temp 98.1°F | Ht 64.0 in | Wt 179.0 lb

## 2023-10-29 DIAGNOSIS — Z0000 Encounter for general adult medical examination without abnormal findings: Secondary | ICD-10-CM

## 2023-10-29 DIAGNOSIS — Z13220 Encounter for screening for lipoid disorders: Secondary | ICD-10-CM

## 2023-10-29 DIAGNOSIS — F4322 Adjustment disorder with anxiety: Secondary | ICD-10-CM | POA: Diagnosis not present

## 2023-10-29 DIAGNOSIS — Z130 Encounter for screening for diseases of the blood and blood-forming organs and certain disorders involving the immune mechanism: Secondary | ICD-10-CM | POA: Diagnosis not present

## 2023-10-29 DIAGNOSIS — Z131 Encounter for screening for diabetes mellitus: Secondary | ICD-10-CM | POA: Diagnosis not present

## 2023-10-29 DIAGNOSIS — Z905 Acquired absence of kidney: Secondary | ICD-10-CM | POA: Diagnosis not present

## 2023-10-29 DIAGNOSIS — Z1329 Encounter for screening for other suspected endocrine disorder: Secondary | ICD-10-CM | POA: Diagnosis not present

## 2023-10-29 DIAGNOSIS — Z23 Encounter for immunization: Secondary | ICD-10-CM

## 2023-10-29 LAB — COMPREHENSIVE METABOLIC PANEL WITH GFR
ALT: 25 U/L (ref 0–35)
AST: 22 U/L (ref 0–37)
Albumin: 4.4 g/dL (ref 3.5–5.2)
Alkaline Phosphatase: 64 U/L (ref 39–117)
BUN: 13 mg/dL (ref 6–23)
CO2: 26 meq/L (ref 19–32)
Calcium: 9.8 mg/dL (ref 8.4–10.5)
Chloride: 104 meq/L (ref 96–112)
Creatinine, Ser: 0.84 mg/dL (ref 0.40–1.20)
GFR: 75.09 mL/min (ref >60.00)
Glucose, Bld: 102 mg/dL — ABNORMAL HIGH (ref 70–99)
Potassium: 4.3 meq/L (ref 3.5–5.1)
Sodium: 138 meq/L (ref 135–145)
Total Bilirubin: 0.5 mg/dL (ref 0.2–1.2)
Total Protein: 7.3 g/dL (ref 6.0–8.3)

## 2023-10-29 LAB — CBC
HCT: 43.6 % (ref 36.0–46.0)
Hemoglobin: 14.4 g/dL (ref 12.0–15.0)
MCHC: 33 g/dL (ref 30.0–36.0)
MCV: 97.2 fl (ref 78.0–100.0)
Platelets: 232 K/uL (ref 150.0–400.0)
RBC: 4.48 Mil/uL (ref 3.87–5.11)
RDW: 12.8 % (ref 11.5–15.5)
WBC: 5.5 K/uL (ref 4.0–10.5)

## 2023-10-29 LAB — LIPID PANEL
Cholesterol: 163 mg/dL (ref 0–200)
HDL: 72.8 mg/dL (ref >39.00)
LDL Cholesterol: 75 mg/dL (ref 0–99)
NonHDL: 90.2
Total CHOL/HDL Ratio: 2
Triglycerides: 76 mg/dL (ref 0.0–149.0)
VLDL: 15.2 mg/dL (ref 0.0–40.0)

## 2023-10-29 LAB — HEMOGLOBIN A1C: Hgb A1c MFr Bld: 6 % (ref 4.6–6.5)

## 2023-10-29 LAB — TSH: TSH: 2.49 u[IU]/mL (ref 0.35–5.50)

## 2023-10-29 MED ORDER — FLUOXETINE HCL 20 MG PO CAPS: 20.0000 mg | ORAL_CAPSULE | Freq: Every day | ORAL | 3 refills | Status: AC

## 2023-10-29 NOTE — Addendum Note (Signed)
 Addended by: Colleena Kurtenbach D on: 10/29/2023 09:23 AM   Modules accepted: Orders

## 2023-11-05 ENCOUNTER — Telehealth: Payer: Self-pay

## 2023-11-05 MED ORDER — COVID-19 MRNA VAC-TRIS(PFIZER) 30 MCG/0.3ML IM SUSY: 0.3000 mL | PREFILLED_SYRINGE | Freq: Once | INTRAMUSCULAR | 0 refills | Status: AC

## 2023-11-05 NOTE — Telephone Encounter (Signed)
 Rx sent.

## 2023-11-05 NOTE — Telephone Encounter (Signed)
 Copied from CRM [id number]. Topic: Clinical - Prescription Issue >> Nov 05, 2023  2:46 PM Carlyon D wrote: Reason for CRM: Pt is calling because she needs a covid script sent to her pharmacy so she can get the covid vaccine.  Preferred pharmacy [facility] [id number] - [place], [place] - [address] [place] [address] Phone: [phone] Fax: [phone]  PT is asking if some one can reach out to her to let her know the script is sent over.

## 2023-11-06 ENCOUNTER — Telehealth: Payer: Self-pay

## 2023-11-06 ENCOUNTER — Encounter: Payer: Self-pay | Admitting: Family Medicine

## 2023-11-06 MED ORDER — ZOLPIDEM TARTRATE 5 MG PO TABS: 5.0000 mg | ORAL_TABLET | Freq: Every evening | ORAL | 0 refills | Status: AC | PRN

## 2023-11-06 NOTE — Telephone Encounter (Signed)
 Copied from CRM [id number]. Topic: Clinical - Medication Question >> Nov 06, 2023  8:20 AM Holly Keller wrote: Reason for CRM: Patient called in stated she is going out of town today, wanted to know if Dr.Copland could prescribe her some ambien  or something for it. Would lke it sent to the pharmacy on file

## 2023-11-06 NOTE — Telephone Encounter (Signed)
 Spoke with pt, pt is aware and expressed understanding. Pt states she has a hard time logging into her Mychart, I provided pt with her username and sent pt a link to reset her password.
